# Patient Record
Sex: Female | Born: 1965 | Race: White | Hispanic: No | Marital: Married | Smoking: Current every day smoker
Health system: Southern US, Community
[De-identification: ages and names within clinical notes are randomized; demographics above are authoritative.]

## PROBLEM LIST (undated history)

## (undated) DIAGNOSIS — R011 Cardiac murmur, unspecified: Secondary | ICD-10-CM

## (undated) DIAGNOSIS — E119 Type 2 diabetes mellitus without complications: Secondary | ICD-10-CM

## (undated) DIAGNOSIS — R531 Weakness: Secondary | ICD-10-CM

## (undated) DIAGNOSIS — I161 Hypertensive emergency: Secondary | ICD-10-CM

## (undated) DIAGNOSIS — I639 Cerebral infarction, unspecified: Secondary | ICD-10-CM

## (undated) DIAGNOSIS — I10 Essential (primary) hypertension: Secondary | ICD-10-CM

## (undated) DIAGNOSIS — R202 Paresthesia of skin: Secondary | ICD-10-CM

## (undated) DIAGNOSIS — E785 Hyperlipidemia, unspecified: Secondary | ICD-10-CM

## (undated) DIAGNOSIS — T7840XA Allergy, unspecified, initial encounter: Secondary | ICD-10-CM

## (undated) HISTORY — DX: Hypertensive emergency: I16.1

## (undated) HISTORY — DX: Weakness: R53.1

## (undated) HISTORY — DX: Hyperlipidemia, unspecified: E78.5

## (undated) HISTORY — DX: Allergy, unspecified, initial encounter: T78.40XA

## (undated) HISTORY — DX: Paresthesia of skin: R20.2

## (undated) HISTORY — PX: OTHER SURGICAL HISTORY: SHX169

## (undated) HISTORY — DX: Cardiac murmur, unspecified: R01.1

## (undated) HISTORY — DX: Cerebral infarction, unspecified: I63.9

## (undated) HISTORY — DX: Type 2 diabetes mellitus without complications: E11.9

---

## 2005-05-25 ENCOUNTER — Emergency Department (HOSPITAL_COMMUNITY): Admission: EM | Admit: 2005-05-25 | Discharge: 2005-05-25 | Payer: Self-pay | Admitting: Emergency Medicine

## 2012-12-23 ENCOUNTER — Other Ambulatory Visit: Payer: Self-pay | Admitting: Family Medicine

## 2012-12-23 DIAGNOSIS — Z1231 Encounter for screening mammogram for malignant neoplasm of breast: Secondary | ICD-10-CM

## 2013-01-22 ENCOUNTER — Ambulatory Visit
Admission: RE | Admit: 2013-01-22 | Discharge: 2013-01-22 | Disposition: A | Discharge status: Home / Self Care | Payer: No Typology Code available for payment source | Source: Ambulatory Visit | Attending: Family Medicine | Admitting: Family Medicine

## 2013-01-22 DIAGNOSIS — Z1231 Encounter for screening mammogram for malignant neoplasm of breast: Secondary | ICD-10-CM

## 2016-06-08 ENCOUNTER — Emergency Department (HOSPITAL_COMMUNITY): Payer: 59

## 2016-06-08 ENCOUNTER — Encounter (HOSPITAL_COMMUNITY): Payer: Self-pay | Admitting: Emergency Medicine

## 2016-06-08 ENCOUNTER — Inpatient Hospital Stay (HOSPITAL_COMMUNITY)
Admission: EM | Admit: 2016-06-08 | Discharge: 2016-06-10 | DRG: 065 | Disposition: A | Discharge status: Home / Self Care | Payer: 59 | Attending: Internal Medicine | Admitting: Internal Medicine

## 2016-06-08 ENCOUNTER — Inpatient Hospital Stay (HOSPITAL_COMMUNITY): Payer: 59

## 2016-06-08 DIAGNOSIS — E1165 Type 2 diabetes mellitus with hyperglycemia: Secondary | ICD-10-CM | POA: Diagnosis present

## 2016-06-08 DIAGNOSIS — Z23 Encounter for immunization: Secondary | ICD-10-CM | POA: Diagnosis not present

## 2016-06-08 DIAGNOSIS — I639 Cerebral infarction, unspecified: Secondary | ICD-10-CM | POA: Diagnosis not present

## 2016-06-08 DIAGNOSIS — E669 Obesity, unspecified: Secondary | ICD-10-CM | POA: Diagnosis present

## 2016-06-08 DIAGNOSIS — Z720 Tobacco use: Secondary | ICD-10-CM

## 2016-06-08 DIAGNOSIS — E785 Hyperlipidemia, unspecified: Secondary | ICD-10-CM | POA: Diagnosis present

## 2016-06-08 DIAGNOSIS — R29702 NIHSS score 2: Secondary | ICD-10-CM | POA: Diagnosis present

## 2016-06-08 DIAGNOSIS — Z823 Family history of stroke: Secondary | ICD-10-CM

## 2016-06-08 DIAGNOSIS — R531 Weakness: Secondary | ICD-10-CM

## 2016-06-08 DIAGNOSIS — R29810 Facial weakness: Secondary | ICD-10-CM | POA: Diagnosis present

## 2016-06-08 DIAGNOSIS — I10 Essential (primary) hypertension: Secondary | ICD-10-CM | POA: Diagnosis present

## 2016-06-08 DIAGNOSIS — E876 Hypokalemia: Secondary | ICD-10-CM | POA: Diagnosis present

## 2016-06-08 DIAGNOSIS — G459 Transient cerebral ischemic attack, unspecified: Secondary | ICD-10-CM | POA: Diagnosis not present

## 2016-06-08 DIAGNOSIS — Z885 Allergy status to narcotic agent status: Secondary | ICD-10-CM

## 2016-06-08 DIAGNOSIS — F17210 Nicotine dependence, cigarettes, uncomplicated: Secondary | ICD-10-CM | POA: Diagnosis present

## 2016-06-08 DIAGNOSIS — I161 Hypertensive emergency: Secondary | ICD-10-CM

## 2016-06-08 DIAGNOSIS — R739 Hyperglycemia, unspecified: Secondary | ICD-10-CM

## 2016-06-08 DIAGNOSIS — R278 Other lack of coordination: Secondary | ICD-10-CM

## 2016-06-08 DIAGNOSIS — Z6837 Body mass index (BMI) 37.0-37.9, adult: Secondary | ICD-10-CM

## 2016-06-08 DIAGNOSIS — G8194 Hemiplegia, unspecified affecting left nondominant side: Secondary | ICD-10-CM | POA: Diagnosis present

## 2016-06-08 HISTORY — DX: Weakness: R53.1

## 2016-06-08 HISTORY — DX: Essential (primary) hypertension: I10

## 2016-06-08 HISTORY — DX: Hypertensive emergency: I16.1

## 2016-06-08 LAB — CBC WITH DIFFERENTIAL/PLATELET
BASOS ABS: 0.1 10*3/uL (ref 0.0–0.1)
BASOS PCT: 1 %
EOS ABS: 0.3 10*3/uL (ref 0.0–0.7)
Eosinophils Relative: 3 %
HCT: 44.4 % (ref 36.0–46.0)
Hemoglobin: 15.8 g/dL — ABNORMAL HIGH (ref 12.0–15.0)
LYMPHS PCT: 36 %
Lymphs Abs: 3.8 10*3/uL (ref 0.7–4.0)
MCH: 31.5 pg (ref 26.0–34.0)
MCHC: 35.6 g/dL (ref 30.0–36.0)
MCV: 88.6 fL (ref 78.0–100.0)
Monocytes Absolute: 0.8 10*3/uL (ref 0.1–1.0)
Monocytes Relative: 7 %
Neutro Abs: 5.5 10*3/uL (ref 1.7–7.7)
Neutrophils Relative %: 53 %
PLATELETS: 194 10*3/uL (ref 150–400)
RBC: 5.01 MIL/uL (ref 3.87–5.11)
RDW: 11.8 % (ref 11.5–15.5)
WBC: 10.3 10*3/uL (ref 4.0–10.5)

## 2016-06-08 LAB — COMPREHENSIVE METABOLIC PANEL
ALBUMIN: 4.3 g/dL (ref 3.5–5.0)
ALT: 24 U/L (ref 14–54)
AST: 26 U/L (ref 15–41)
Alkaline Phosphatase: 106 U/L (ref 38–126)
Anion gap: 8 (ref 5–15)
BUN: 13 mg/dL (ref 6–20)
CALCIUM: 9.5 mg/dL (ref 8.9–10.3)
CO2: 27 mmol/L (ref 22–32)
Chloride: 105 mmol/L (ref 101–111)
Creatinine, Ser: 0.86 mg/dL (ref 0.44–1.00)
GFR calc Af Amer: >60 mL/min (ref >60)
GFR calc non Af Amer: >60 mL/min (ref >60)
GLUCOSE: 113 mg/dL — AB (ref 65–99)
POTASSIUM: 3.3 mmol/L — AB (ref 3.5–5.1)
SODIUM: 140 mmol/L (ref 135–145)
TOTAL PROTEIN: 7.6 g/dL (ref 6.5–8.1)
Total Bilirubin: 0.5 mg/dL (ref 0.3–1.2)

## 2016-06-08 LAB — PROTIME-INR
INR: 0.99
PROTHROMBIN TIME: 13 s (ref 11.4–15.2)

## 2016-06-08 MED ORDER — POTASSIUM CHLORIDE CRYS ER 20 MEQ PO TBCR
20.0000 meq | EXTENDED_RELEASE_TABLET | Freq: Once | ORAL | Status: AC
  Administered 2016-06-08: 20 meq via ORAL
  Filled 2016-06-08: qty 1

## 2016-06-08 MED ORDER — IOPAMIDOL (ISOVUE-370) INJECTION 76%
75.0000 mL | Freq: Once | INTRAVENOUS | Status: AC | PRN
  Administered 2016-06-08: 75 mL via INTRAVENOUS

## 2016-06-08 MED ORDER — HYDRALAZINE HCL 20 MG/ML IJ SOLN
10.0000 mg | Freq: Four times a day (QID) | INTRAMUSCULAR | Status: DC | PRN
  Administered 2016-06-09: 10 mg via INTRAVENOUS
  Filled 2016-06-08: qty 1

## 2016-06-08 MED ORDER — NICOTINE 21 MG/24HR TD PT24
21.0000 mg | MEDICATED_PATCH | Freq: Every day | TRANSDERMAL | Status: DC | PRN
  Administered 2016-06-09: 21 mg via TRANSDERMAL
  Filled 2016-06-08 (×2): qty 1

## 2016-06-08 MED ORDER — HYDRALAZINE HCL 20 MG/ML IJ SOLN
5.0000 mg | Freq: Once | INTRAMUSCULAR | Status: AC
  Administered 2016-06-08: 5 mg via INTRAVENOUS
  Filled 2016-06-08: qty 1

## 2016-06-08 MED ORDER — PNEUMOCOCCAL VAC POLYVALENT 25 MCG/0.5ML IJ INJ: 0.5000 mL | INJECTION | INTRAMUSCULAR | Status: DC

## 2016-06-08 MED ORDER — STROKE: EARLY STAGES OF RECOVERY BOOK
Freq: Once | Status: DC
  Filled 2016-06-08: qty 1

## 2016-06-08 MED ORDER — INFLUENZA VAC SPLIT QUAD 0.5 ML IM SUSY
0.5000 mL | PREFILLED_SYRINGE | INTRAMUSCULAR | Status: AC
  Administered 2016-06-09: 0.5 mL via INTRAMUSCULAR
  Filled 2016-06-08: qty 0.5

## 2016-06-08 MED ORDER — AMLODIPINE BESYLATE 5 MG PO TABS
5.0000 mg | ORAL_TABLET | Freq: Every day | ORAL | Status: DC
  Administered 2016-06-08 – 2016-06-10 (×3): 5 mg via ORAL
  Filled 2016-06-08 (×3): qty 1

## 2016-06-08 NOTE — ED Triage Notes (Signed)
Patient reports waking this morning at 6am she was "feeling off balanced." Per patient weakness on left side with change in motor skills (ability to grip). Per patient "walking off balance." Denies any slurred speech, dizziness, confusion, or facial drooping. Per patient when she went to bed at 9pm yesterday she was fine.  Patient does report slight headache "that just started."

## 2016-06-08 NOTE — ED Provider Notes (Signed)
[facility]Y DEPT Provider Note   CSN: [id number] Arrival date & time: 06/08/16  1403     History   Chief Complaint Chief Complaint  Patient presents with  . Extremity Weakness    HPI Dana Ryan is a 50 y.o. female.  Patient states she went to bed around 9:00 last night and felt fine and then woke up this morning with weakness in her left arm and left leg   The history is provided by the patient. No language interpreter was used.  Extremity Weakness  This is a new problem. The current episode started 12 to 24 hours ago. The problem occurs constantly. The problem has not changed since onset.Pertinent negatives include no chest pain, no abdominal pain and no headaches. Nothing aggravates the symptoms. Nothing relieves the symptoms. She has tried nothing for the symptoms. The treatment provided no relief.    Past Medical History:  Diagnosis Date  . Diabetes mellitus without complication (HCC)     There are no active problems to display for this patient.   History reviewed. No pertinent surgical history.  OB History    Gravida Para Term Preterm AB Living   0 0 0 0 0 0   SAB TAB Ectopic Multiple Live Births   0 0 0 0 0       Home Medications    Prior to Admission medications   Not on File    Family History Family History  Problem Relation Age of Onset  . Cancer Mother   . Hyperlipidemia Mother   . Cancer Father   . Hyperlipidemia Father     Social History Social History  Substance Use Topics  . Smoking status: Current Some Day Smoker    Packs/day: 1.00    Years: 20.00    Types: Cigarettes  . Smokeless tobacco: Never Used  . Alcohol use Yes     Comment: rarely     Allergies   Codeine   Review of Systems Review of Systems  Constitutional: Negative for appetite change and fatigue.  HENT: Negative for congestion, ear discharge and sinus pressure.   Eyes: Negative for discharge.  Respiratory: Negative for cough.   Cardiovascular:  Negative for chest pain.  Gastrointestinal: Negative for abdominal pain and diarrhea.  Genitourinary: Negative for frequency and hematuria.  Musculoskeletal: Positive for extremity weakness. Negative for back pain.  Skin: Negative for rash.  Neurological: Negative for seizures and headaches.       Weakness left arm left leg  Psychiatric/Behavioral: Negative for hallucinations.     Physical Exam Updated Vital Signs BP (!) 181/118 (BP Location: Left Arm)   Pulse 79   Temp 98.2 F (36.8 C) (Oral)   Resp 13   Ht 5' 1.6" (1.565 m)   Wt 200 lb (90.7 kg)   SpO2 99%   BMI 37.06 kg/m   Physical Exam  Constitutional: She is oriented to person, place, and time. She appears well-developed.  HENT:  Head: Normocephalic.  Eyes: Conjunctivae and EOM are normal. No scleral icterus.  Neck: Neck supple. No thyromegaly present.  Cardiovascular: Normal rate and regular rhythm.  Exam reveals no gallop and no friction rub.   No murmur heard. Pulmonary/Chest: No stridor. She has no wheezes. She has no rales. She exhibits no tenderness.  Abdominal: She exhibits no distension. There is no tenderness. There is no rebound.  Musculoskeletal: Normal range of motion. She exhibits no edema.  Lymphadenopathy:    She has no cervical adenopathy.  Neurological: She is oriented  to person, place, and time. She exhibits normal muscle tone. Coordination abnormal.  Patient has poor coordination in her left hand along with mild weakness in the left hand and left leg neurovascular exams normal  Skin: No rash noted. No erythema.  Psychiatric: She has a normal mood and affect. Her behavior is normal.     ED Treatments / Results  Labs (all labs ordered are listed, but only abnormal results are displayed) Labs Reviewed  CBC WITH DIFFERENTIAL/PLATELET - Abnormal; Notable for the following:       Result Value   Hemoglobin 15.8 (*)    All other components within normal limits  COMPREHENSIVE METABOLIC PANEL -  Abnormal; Notable for the following:    Potassium 3.3 (*)    Glucose, Bld 113 (*)    All other components within normal limits  PROTIME-INR    EKG  EKG Interpretation None       Radiology Ct Head Wo Contrast  Result Date: 06/08/2016 CLINICAL DATA:  50 year old female with a history of left-sided weakness EXAM: CT HEAD WITHOUT CONTRAST TECHNIQUE: Contiguous axial images were obtained from the base of the skull through the vertex without intravenous contrast. COMPARISON:  None. FINDINGS: Brain: No acute intracranial hemorrhage. No midline shift or mass effect. Gray-white differentiation maintained. Unremarkable appearance of the ventricular system. Vascular: Calcifications of intracranial vasculature. Relatively dense appearance of the basilar artery. Skull: No acute fracture.  No aggressive bone lesion identified. Sinuses/Orbits: Unremarkable appearance of the orbits. Mastoid air cells clear. No middle ear effusion. No significant sinus disease. Other: None IMPRESSION: No CT evidence of acute intracranial abnormality. Relatively dense appearance of the basilar artery, which is a nonspecific finding, however, if there were concern for acute neurologic problem and a possibility of thrombosis, CTA may be considered. Signed, Yvone NeuJaime S. Loreta AveWagner, DO [facility] Electronically Signed   By: Gilmer MorJaime  Wagner D.O.   On: 06/08/2016 15:17    Procedures Procedures (including critical care time)  Medications Ordered in ED Medications  hydrALAZINE (APRESOLINE) injection 5 mg (5 mg Intravenous Given 06/08/16 1440)     Initial Impression / Assessment and Plan / ED Course  I have reviewed the triage vital signs and the nursing notes.  Pertinent labs & imaging results that were available during my care of the patient were reviewed by me and considered in my medical decision making (see chart for details).  Clinical Course   CRITICAL CARE Performed  by: Leonid Manus L Total critical care time: 45 minutes Critical care time was exclusive of separately billable procedures and treating other patients. Critical care was necessary to treat or prevent imminent or life-threatening deterioration. Critical care was time spent personally by me on the following activities: development of treatment plan with patient and/or surrogate as well as nursing, discussions with consultants, evaluation of patient's response to treatment, examination of patient, obtaining history from patient or surrogate, ordering and performing treatments and interventions, ordering and review of laboratory studies, ordering and review of radiographic studies, pulse oximetry and re-evaluation of patient's condition. Patient with weakness in left arm and left leg. CT scan does not show acute stroke.  Patient will get a CTA and be admitted  for stroke  Final Clinical Impressions(s) / ED Diagnoses   Final diagnoses:  None    New Prescriptions New Prescriptions   No medications on file     Bethann BerkshireJoseph Imad Shostak, MD 06/08/16 1538

## 2016-06-08 NOTE — H&P (Signed)
[facility] H&P   Patient Demographics:    Dana Ryan, is a 50 y.o. female  MRN: [id number]   DOB - Aug 11, 1966  Admit Date - 06/08/2016  Outpatient Primary MD for the patient is No PCP Per Patient  Referring MD/NP/PA:  Dorthula PerfectJoseph Zammitt  Outpatient Specialists:   Patient coming from: home  Chief Complaint  Patient presents with  . Extremity Weakness      HPI:    Dana InaMichelle Armitage  is a 50 y.o. female, with hypertension, (recently had bp medication changed), tobacco use,  w c/o left upper extremity and left lower ext weakness upon waking this am.  Pt states that her weakness is currently significantly better.  Pt denies headache, vision change, slurred speech, cp, palp, sob, numbness, tingling. Pt presented to the ED for evaluation.   In, ED, pt noted to be hypertensive and CT brain negative for CVA.  CTA neck no significant stenosis. Pt will be admitted for TIA ro CVA.      Review of systems:    In addition to the HPI above,  No Fever-chills, No Headache, No changes with Vision or hearing, No problems swallowing food or Liquids, No Chest pain, Cough or Shortness of Breath, No Abdominal pain, No Nausea or Vommitting, Bowel movements are regular, No Blood in stool or Urine, No dysuria, No new skin rashes or bruises, No new joints pains-aches,  No new tingling, numbness in any extremity, No recent weight gain or loss, No polyuria, polydypsia or polyphagia, No significant Mental Stressors.  A full 10 point Review of Systems was done, except as stated above, all other Review of Systems were negative.   With Past History of the following :    Past Medical History:  Diagnosis Date  . Hypertension       History reviewed. No pertinent surgical history. No surgical history per pt   Social History:     Social History  Substance Use Topics  . Smoking status:  Current Some Day Smoker    Packs/day: 1.00    Years: 20.00    Types: Cigarettes  . Smokeless tobacco: Never Used  . Alcohol use 0.0 oz/week     Comment: rarely     Lives -  At home  Mobility -   Mobile, walks by self   Family History :     Family History  Problem Relation Age of Onset  . Cancer Mother   . Hyperlipidemia Mother   . Cancer Father    Maternal grandfather had CVA   Home Medications:   Prior to Admission medications   Not on File  Pt not certain   Allergies:     Allergies  Allergen Reactions  . Codeine Hives     Physical Exam:   Vitals  Blood pressure 156/99, pulse 68, temperature 98.2 F (36.8 C), temperature source Oral, resp. rate 18, height 5' 1.6" (  1.565 m), weight 90.7 kg (200 lb), SpO2 97 %.   1. General  lying in bed in NAD,    2. Normal affect and insight, Not Suicidal or Homicidal, Awake Alert, Oriented X 3.  3. No F.N deficits, ALL C.Nerves Intact, Strength 5/5 all 4 extremities except  5-/5 grip, and 5-/5 plantar flexion Sensation intact all 4 extremities, Plantars down going.    4. Ears and Eyes appear Normal, Conjunctivae clear, PERRLA. Moist Oral Mucosa.  5. Supple Neck, No JVD, No cervical lymphadenopathy appriciated, No Carotid Bruits.  6. Symmetrical Chest wall movement, Good air movement bilaterally, CTAB.  7. RRR, No Gallops, Rubs or Murmurs, No Parasternal Heave.  8. Positive Bowel Sounds, Abdomen Soft, No tenderness, No organomegaly appriciated,No rebound -guarding or rigidity.  9.  No Cyanosis, Normal Skin Turgor, No Skin Rash or Bruise.  10. Good muscle tone,  joints appear normal , no effusions, Normal ROM.  11. No Palpable Lymph Nodes in Neck or Axillae     Data Review:    CBC  Recent Labs Lab 06/08/16 1429  WBC 10.3  HGB 15.8*  HCT 44.4  PLT 194  MCV 88.6  MCH 31.5  MCHC 35.6  RDW 11.8  LYMPHSABS 3.8  MONOABS 0.8  EOSABS 0.3  BASOSABS 0.1    ------------------------------------------------------------------------------------------------------------------  Chemistries   Recent Labs Lab 06/08/16 1429  NA 140  K 3.3*  CL 105  CO2 27  GLUCOSE 113*  BUN 13  CREATININE 0.86  CALCIUM 9.5  AST 26  ALT 24  ALKPHOS 106  BILITOT 0.5   ------------------------------------------------------------------------------------------------------------------ estimated creatinine clearance is 82.2 mL/min (by C-G formula based on SCr of 0.86 mg/dL). ------------------------------------------------------------------------------------------------------------------ No results for input(s): TSH, T4TOTAL, T3FREE, THYROIDAB in the last 72 hours.  Invalid input(s): FREET3  Coagulation profile  Recent Labs Lab 06/08/16 1429  INR 0.99   ------------------------------------------------------------------------------------------------------------------- No results for input(s): DDIMER in the last 72 hours. -------------------------------------------------------------------------------------------------------------------  Cardiac Enzymes No results for input(s): CKMB, TROPONINI, MYOGLOBIN in the last 168 hours.  Invalid input(s): CK ------------------------------------------------------------------------------------------------------------------ No results found for: BNP   ---------------------------------------------------------------------------------------------------------------  Urinalysis No results found for: COLORURINE, APPEARANCEUR, LABSPEC, PHURINE, GLUCOSEU, HGBUR, BILIRUBINUR, KETONESUR, PROTEINUR, UROBILINOGEN, NITRITE, LEUKOCYTESUR  ----------------------------------------------------------------------------------------------------------------   Imaging Results:    Ct Angio Head W Or Wo Contrast  Result Date: 06/08/2016 CLINICAL DATA:  Patient awoke earlier today with LEFT-sided weakness. Imbalance and headache.  EXAM: CT ANGIOGRAPHY HEAD AND NECK TECHNIQUE: Multidetector CT imaging of the head and neck was performed using the standard protocol during bolus administration of intravenous contrast. Multiplanar CT image reconstructions and MIPs were obtained to evaluate the vascular anatomy. Carotid stenosis measurements (when applicable) are obtained utilizing NASCET criteria, using the distal internal carotid diameter as the denominator. CONTRAST:  Isovue 370, 75 mL. COMPARISON:  CT head earlier today. FINDINGS: CT HEAD Calvarium and skull base: No fracture or destructive lesion. Mastoids and middle ears are grossly clear. Paranasal sinuses: Imaged portions are clear. Orbits: Negative. Brain: No evidence of acute abnormality, including acute infarct, hemorrhage, hydrocephalus, or mass lesion. CTA NECK Aortic arch: Normal variant branching, with direct origin of the LEFT vertebral from the arch. Imaged portion shows no evidence of aneurysm or dissection. No significant stenosis of the major arch vessel origins. Right carotid system: Minor atheromatous change. No evidence of dissection, stenosis (50% or greater) or occlusion. Left carotid system: Minor atheromatous change. No evidence of dissection, stenosis (50% or greater) or occlusion. Vertebral arteries: RIGHT vertebral dominant. No evidence of dissection,  stenosis (50% or greater) or occlusion. Nonvascular soft tissues:  Unremarkable. Review of the MIP images confirms the above findings. CTA HEAD Anterior circulation: Minor cavernous calcification, insignificant. No significant stenosis, proximal occlusion, aneurysm, or vascular malformation. Posterior circulation: No evidence for basilar thrombosis. Appearance on CT was artifactual. Minor non stenotic calcification distal RIGHT vertebral artery. No significant stenosis, proximal occlusion, aneurysm, or vascular malformation. Venous sinuses: As permitted by contrast timing, patent. Anatomic variants: Hypoplastic A1 ACA,  RIGHT. Delayed phase:   No abnormal intracranial enhancement. Review of the MIP images confirms the above findings. IMPRESSION: No intracranial or extracranial flow reducing lesion of significance. Special attention was directed to the CT finding of dense basilar artery, with no evidence for thrombosis. Electronically Signed   By: Elsie Stain M.D.   On: 06/08/2016 16:31   Ct Head Wo Contrast  Result Date: 06/08/2016 CLINICAL DATA:  50 year old female with a history of left-sided weakness EXAM: CT HEAD WITHOUT CONTRAST TECHNIQUE: Contiguous axial images were obtained from the base of the skull through the vertex without intravenous contrast. COMPARISON:  None. FINDINGS: Brain: No acute intracranial hemorrhage. No midline shift or mass effect. Gray-white differentiation maintained. Unremarkable appearance of the ventricular system. Vascular: Calcifications of intracranial vasculature. Relatively dense appearance of the basilar artery. Skull: No acute fracture.  No aggressive bone lesion identified. Sinuses/Orbits: Unremarkable appearance of the orbits. Mastoid air cells clear. No middle ear effusion. No significant sinus disease. Other: None IMPRESSION: No CT evidence of acute intracranial abnormality. Relatively dense appearance of the basilar artery, which is a nonspecific finding, however, if there were concern for acute neurologic problem and a possibility of thrombosis, CTA may be considered. Signed, Yvone Neu. Loreta Ave, DO [facility] Electronically Signed   By: Gilmer Mor D.O.   On: 06/08/2016 15:17   Ct Angio Neck W And/or Wo Contrast  Result Date: 06/08/2016 CLINICAL DATA:  Patient awoke earlier today with LEFT-sided weakness. Imbalance and headache. EXAM: CT ANGIOGRAPHY HEAD AND NECK TECHNIQUE: Multidetector CT imaging of the head and neck was performed using the standard protocol during bolus administration of intravenous contrast.  Multiplanar CT image reconstructions and MIPs were obtained to evaluate the vascular anatomy. Carotid stenosis measurements (when applicable) are obtained utilizing NASCET criteria, using the distal internal carotid diameter as the denominator. CONTRAST:  Isovue 370, 75 mL. COMPARISON:  CT head earlier today. FINDINGS: CT HEAD Calvarium and skull base: No fracture or destructive lesion. Mastoids and middle ears are grossly clear. Paranasal sinuses: Imaged portions are clear. Orbits: Negative. Brain: No evidence of acute abnormality, including acute infarct, hemorrhage, hydrocephalus, or mass lesion. CTA NECK Aortic arch: Normal variant branching, with direct origin of the LEFT vertebral from the arch. Imaged portion shows no evidence of aneurysm or dissection. No significant stenosis of the major arch vessel origins. Right carotid system: Minor atheromatous change. No evidence of dissection, stenosis (50% or greater) or occlusion. Left carotid system: Minor atheromatous change. No evidence of dissection, stenosis (50% or greater) or occlusion. Vertebral arteries: RIGHT vertebral dominant. No evidence of dissection, stenosis (50% or greater) or occlusion. Nonvascular soft tissues:  Unremarkable. Review of the MIP images confirms the above findings. CTA HEAD Anterior circulation: Minor cavernous calcification, insignificant. No significant stenosis, proximal occlusion, aneurysm, or vascular malformation. Posterior circulation: No evidence for basilar thrombosis. Appearance on CT was artifactual. Minor non stenotic calcification distal RIGHT vertebral artery. No significant stenosis, proximal occlusion, aneurysm, or vascular malformation. Venous sinuses: As permitted by contrast  timing, patent. Anatomic variants: Hypoplastic A1 ACA, RIGHT. Delayed phase:   No abnormal intracranial enhancement. Review of the MIP images confirms the above findings. IMPRESSION: No intracranial or extracranial flow reducing lesion of  significance. Special attention was directed to the CT finding of dense basilar artery, with no evidence for thrombosis. Electronically Signed   By: Elsie Stain M.D.   On: 06/08/2016 16:31   NSR at 80, lad, q in v1-3, no st-t changes c/w ischemia   Assessment & Plan:    Active Problems:   Left-sided weakness   Hypertensive emergency   Hypokalemia   Hyperglycemia   Tobacco use    1. TIA Tele MRI brain Aspirin, lipitor Start amlodipine 5mg  po qday (since was on this at home) Check hga1c, lipid  2. Hypokalemia Replete Check cmp  3. Hyperglycemia Check hga1c,   4. Tobacco use Nicotine patch  5. Hypertensive emergency Hydralazine 10mg  iv q6hprn sbp >170  DVT Prophylaxis  - SCDs   AM Labs Ordered, also please review Full Orders  Family Communication: Admission, patients condition and plan of care including tests being ordered have been discussed with the patient  who indicate understanding and agree with the plan and Code Status.  Code Status FULL CODE  Likely DC to  home  Condition GUARDED   Consults called:  Neurology requested  Admission status: inpatient  Time spent in minutes : 50 minutes   Pearson Grippe M.D on 06/08/2016 at 6:11 PM  Between 7am to 7pm - Pager - [phone] After 7pm go to [email or web address] - password [facility]  [facility] - Office  [phone]

## 2016-06-08 NOTE — ED Notes (Addendum)
Delay in sending pt to room as she was not sure she was going to stay.  Hospitalist went to talk with pt and now she is going to stay.

## 2016-06-09 ENCOUNTER — Inpatient Hospital Stay (HOSPITAL_COMMUNITY): Payer: 59

## 2016-06-09 DIAGNOSIS — G459 Transient cerebral ischemic attack, unspecified: Secondary | ICD-10-CM

## 2016-06-09 DIAGNOSIS — R531 Weakness: Secondary | ICD-10-CM

## 2016-06-09 LAB — ECHOCARDIOGRAM COMPLETE
Ao-asc: 31 cm
CHL CUP DOP CALC LVOT VTI: 30.9 cm
CHL CUP MV DEC (S): 306
E/e' ratio: 16.66
EWDT: 306 ms
FS: 37 % (ref 28–44)
HEIGHTINCHES: 62 in
IV/PV OW: 1.24
LA ID, A-P, ES: 33 mm
LA diam index: 1.61 cm/m2
LA vol A4C: 54.1 ml
LA vol index: 21.4 mL/m2
LA vol: 43.9 mL
LEFT ATRIUM END SYS DIAM: 33 mm
LV E/e' medial: 16.66
LV PW d: 11.5 mm — AB (ref 0.6–1.1)
LV TDI E'LATERAL: 5.33
LV TDI E'MEDIAL: 4.57
LV e' LATERAL: 5.33 cm/s
LVEEAVG: 16.66
LVOT SV: 62 mL
LVOT area: 2.01 cm2
LVOT peak grad rest: 7 mmHg
LVOTD: 16 mm
LVOTPV: 129 cm/s
MV Peak grad: 3 mmHg
MV pk A vel: 98.5 m/s
MV pk E vel: 88.8 m/s
TAPSE: 23 mm
WEIGHTICAEL: 3241.64 [oz_av]

## 2016-06-09 LAB — COMPREHENSIVE METABOLIC PANEL
ALT: 25 U/L (ref 14–54)
AST: 26 U/L (ref 15–41)
Albumin: 4.3 g/dL (ref 3.5–5.0)
Alkaline Phosphatase: 99 U/L (ref 38–126)
Anion gap: 9 (ref 5–15)
BUN: 12 mg/dL (ref 6–20)
CHLORIDE: 103 mmol/L (ref 101–111)
CO2: 25 mmol/L (ref 22–32)
CREATININE: 0.88 mg/dL (ref 0.44–1.00)
Calcium: 9.6 mg/dL (ref 8.9–10.3)
GFR calc Af Amer: >60 mL/min (ref >60)
GFR calc non Af Amer: >60 mL/min (ref >60)
Glucose, Bld: 135 mg/dL — ABNORMAL HIGH (ref 65–99)
Potassium: 3.8 mmol/L (ref 3.5–5.1)
SODIUM: 137 mmol/L (ref 135–145)
Total Bilirubin: 0.7 mg/dL (ref 0.3–1.2)
Total Protein: 7.7 g/dL (ref 6.5–8.1)

## 2016-06-09 LAB — LIPID PANEL
CHOL/HDL RATIO: 4.4 ratio
Cholesterol: 190 mg/dL (ref 0–200)
HDL: 43 mg/dL (ref >40)
LDL Cholesterol: 128 mg/dL — ABNORMAL HIGH (ref 0–99)
Triglycerides: 93 mg/dL (ref <150)
VLDL: 19 mg/dL (ref 0–40)

## 2016-06-09 MED ORDER — ACETAMINOPHEN 325 MG PO TABS
650.0000 mg | ORAL_TABLET | Freq: Four times a day (QID) | ORAL | Status: DC | PRN
  Administered 2016-06-09 – 2016-06-10 (×2): 650 mg via ORAL
  Filled 2016-06-09 (×2): qty 2

## 2016-06-09 MED ORDER — ENOXAPARIN SODIUM 60 MG/0.6ML ~~LOC~~ SOLN
45.0000 mg | SUBCUTANEOUS | Status: DC
  Administered 2016-06-09: 45 mg via SUBCUTANEOUS
  Filled 2016-06-09: qty 0.6

## 2016-06-09 MED ORDER — ATORVASTATIN CALCIUM 40 MG PO TABS
80.0000 mg | ORAL_TABLET | Freq: Every day | ORAL | Status: DC
  Administered 2016-06-09: 80 mg via ORAL
  Filled 2016-06-09: qty 2

## 2016-06-09 MED ORDER — ASPIRIN EC 81 MG PO TBEC
81.0000 mg | DELAYED_RELEASE_TABLET | Freq: Every day | ORAL | Status: DC
  Administered 2016-06-10: 81 mg via ORAL
  Filled 2016-06-09: qty 1

## 2016-06-09 NOTE — Progress Notes (Signed)
SLP Cancellation Note  Patient Details Name: Dana Ryan MRN: [id number] DOB: August 14, 1966   Cancelled treatment:       Reason Eval/Treat Not Completed: SLP screened, no needs identified, will sign off; SLP spoke with pt and spouse in room. Pt denies changes in cognition, swallowing, speech, or swallowing. She does endorse a headache and decreased fine motor skills in left hand. SLP suspects very mild dysarthria and husband agrees (very slight). Pt most concerned about not being able to return to typing 70 words per minute and quilting. Pt observed drinking soda during my visit and no difficulties noted (pt passed RN swallow screen). Will defer formal SLE at this time. Pt/spouse appreciative for visit.   Thank you,  Dana Ryan, Dana Ryan [phone]    Dana Ryan 06/09/2016, 3:31 PM

## 2016-06-09 NOTE — Progress Notes (Signed)
Notified Dr. Felecia ShellingFanta for the patients request to eat.  Voiced to him that the patient had passed her RN swallow screen and was waiting to see the neuro and MRI.  New orders given and followed.

## 2016-06-09 NOTE — Progress Notes (Signed)
PT Cancellation Note  Patient Details Name: Dana Ryan MRN: [id number] DOB: 01/07/1966   Cancelled Treatment:     Theapist spoke to pt.  Pt is up walking halls with husband feels that she does not need physical therapy services   Virgina OrganCynthia Russell, PT CLT [phone] 06/09/2016, 12:35 PM

## 2016-06-09 NOTE — Progress Notes (Signed)
  Echocardiogram 2D Echocardiogram has been performed.  Dana Ryan, Dana Ryan 06/09/2016, 10:49 AM

## 2016-06-09 NOTE — Progress Notes (Signed)
PROGRESS NOTE    Dana Ryan  ZOX:[id number] DOB: Nov 11, 1965 DOA: 06/08/2016 PCP: No PCP Per Patient     Brief Narrative:  50 year old woman admitted from home on 10/7 with complaints of left-sided weakness. Was admitted for TIA versus CVA.   Assessment & Plan:   Active Problems:   Left-sided weakness   Hypertensive emergency   Hypokalemia   Hyperglycemia   Tobacco use   TIA (transient ischemic attack)   Left-sided weakness -Initial CT head negative, MRI not available until tomorrow. -Suspect she has had a CVA versus a TIA given her left-sided facial droop and residual weakness of her left arm and leg. -Continue aspirin for secondary stroke prevention. -Echo ordered and pending. -CTA of the neck shows no intra-or extracranial flow reducing lesion of significance. -PT and ST have deferred evaluation given improved symptoms. -Patient advised on risk factor modification including tobacco cessation, improved management of hypertension, weight reduction. Check lipid panel to see if statin indicated.   DVT prophylaxis: Lovenox Code Status: Full code Family Communication: Patient only Disposition Plan: Anticipate discharge home in 24 hours once MRI complete  Consultants:   None  Procedures:   None  Antimicrobials:   None    Subjective: Feels improved, still has residual weakness of her left arm and left leg, specifically mentions decrease in fine motor skills  Objective: Vitals:   06/09/16 0746 06/09/16 1150 06/09/16 1315 06/09/16 1358  BP: (!) 184/94 (!) 178/100 (!) 158/84 (!) 147/77  Pulse: 72 64  86  Resp: 18     Temp: 98 F (36.7 C)     TempSrc: Oral     SpO2: 97%     Weight:      Height:       No intake or output data in the 24 hours ending 06/09/16 1639 Filed Weights   06/08/16 1411 06/08/16 1915  Weight: 90.7 kg (200 lb) 91.9 kg (202 lb 9.6 oz)    Examination:  General exam: Alert, awake, oriented x 3 Respiratory system: Clear to  auscultation. Respiratory effort normal. Cardiovascular system:RRR. No murmurs, rubs, gallops. Gastrointestinal system: Abdomen is nondistended, soft and nontender. No organomegaly or masses felt. Normal bowel sounds heard. Central nervous system: Left facial droop, minimally perceived weakness of left arm and left leg Extremities: No C/C/E, +pedal pulses Skin: No rashes, lesions or ulcers Psychiatry: Judgement and insight appear normal. Mood & affect appropriate.     Data Reviewed: I have personally reviewed following labs and imaging studies  CBC:  Recent Labs Lab 06/08/16 1429  WBC 10.3  NEUTROABS 5.5  HGB 15.8*  HCT 44.4  MCV 88.6  PLT 194   Basic Metabolic Panel:  Recent Labs Lab 06/08/16 1429 06/09/16 0724  NA 140 137  K 3.3* 3.8  CL 105 103  CO2 27 25  GLUCOSE 113* 135*  BUN 13 12  CREATININE 0.86 0.88  CALCIUM 9.5 9.6   GFR: Estimated Creatinine Clearance: 81.5 mL/min (by C-G formula based on SCr of 0.88 mg/dL). Liver Function Tests:  Recent Labs Lab 06/08/16 1429 06/09/16 0724  AST 26 26  ALT 24 25  ALKPHOS 106 99  BILITOT 0.5 0.7  PROT 7.6 7.7  ALBUMIN 4.3 4.3   No results for input(s): LIPASE, AMYLASE in the last 168 hours. No results for input(s): AMMONIA in the last 168 hours. Coagulation Profile:  Recent Labs Lab 06/08/16 1429  INR 0.99   Cardiac Enzymes: No results for input(s): CKTOTAL, CKMB, CKMBINDEX, TROPONINI in the last 168  hours. BNP (last 3 results) No results for input(s): PROBNP in the last 8760 hours. HbA1C: No results for input(s): HGBA1C in the last 72 hours. CBG: No results for input(s): GLUCAP in the last 168 hours. Lipid Profile:  Recent Labs  06/09/16 0724  CHOL 190  HDL 43  LDLCALC 128*  TRIG 93  CHOLHDL 4.4   Thyroid Function Tests: No results for input(s): TSH, T4TOTAL, FREET4, T3FREE, THYROIDAB in the last 72 hours. Anemia Panel: No results for input(s): VITAMINB12, FOLATE, FERRITIN, TIBC, IRON,  RETICCTPCT in the last 72 hours. Urine analysis: No results found for: COLORURINE, APPEARANCEUR, LABSPEC, PHURINE, GLUCOSEU, HGBUR, BILIRUBINUR, KETONESUR, PROTEINUR, UROBILINOGEN, NITRITE, LEUKOCYTESUR Sepsis Labs: @LABRCNTIP (procalcitonin:4,lacticidven:4)  )No results found for this or any previous visit (from the past 240 hour(s)).       Radiology Studies: Ct Angio Head W Or Wo Contrast  Result Date: 06/08/2016 CLINICAL DATA:  Patient awoke earlier today with LEFT-sided weakness. Imbalance and headache. EXAM: CT ANGIOGRAPHY HEAD AND NECK TECHNIQUE: Multidetector CT imaging of the head and neck was performed using the standard protocol during bolus administration of intravenous contrast. Multiplanar CT image reconstructions and MIPs were obtained to evaluate the vascular anatomy. Carotid stenosis measurements (when applicable) are obtained utilizing NASCET criteria, using the distal internal carotid diameter as the denominator. CONTRAST:  Isovue 370, 75 mL. COMPARISON:  CT head earlier today. FINDINGS: CT HEAD Calvarium and skull base: No fracture or destructive lesion. Mastoids and middle ears are grossly clear. Paranasal sinuses: Imaged portions are clear. Orbits: Negative. Brain: No evidence of acute abnormality, including acute infarct, hemorrhage, hydrocephalus, or mass lesion. CTA NECK Aortic arch: Normal variant branching, with direct origin of the LEFT vertebral from the arch. Imaged portion shows no evidence of aneurysm or dissection. No significant stenosis of the major arch vessel origins. Right carotid system: Minor atheromatous change. No evidence of dissection, stenosis (50% or greater) or occlusion. Left carotid system: Minor atheromatous change. No evidence of dissection, stenosis (50% or greater) or occlusion. Vertebral arteries: RIGHT vertebral dominant. No evidence of dissection, stenosis (50% or greater) or occlusion. Nonvascular soft tissues:  Unremarkable. Review of the MIP  images confirms the above findings. CTA HEAD Anterior circulation: Minor cavernous calcification, insignificant. No significant stenosis, proximal occlusion, aneurysm, or vascular malformation. Posterior circulation: No evidence for basilar thrombosis. Appearance on CT was artifactual. Minor non stenotic calcification distal RIGHT vertebral artery. No significant stenosis, proximal occlusion, aneurysm, or vascular malformation. Venous sinuses: As permitted by contrast timing, patent. Anatomic variants: Hypoplastic A1 ACA, RIGHT. Delayed phase:   No abnormal intracranial enhancement. Review of the MIP images confirms the above findings. IMPRESSION: No intracranial or extracranial flow reducing lesion of significance. Special attention was directed to the CT finding of dense basilar artery, with no evidence for thrombosis. Electronically Signed   By: Elsie Stain M.D.   On: 06/08/2016 16:31   Dg Chest 2 View  Result Date: 06/08/2016 CLINICAL DATA:  TIA. EXAM: CHEST  2 VIEW COMPARISON:  None. FINDINGS: Cardiomediastinal silhouette is normal. Mediastinal contours appear intact. There is no evidence of focal airspace consolidation, pleural effusion or pneumothorax. Osseous structures are without acute abnormality. Soft tissues are grossly normal. IMPRESSION: No active cardiopulmonary disease. Electronically Signed   By: Ted Mcalpine M.D.   On: 06/08/2016 21:42   Ct Head Wo Contrast  Result Date: 06/08/2016 CLINICAL DATA:  50 year old female with a history of left-sided weakness EXAM: CT HEAD WITHOUT CONTRAST TECHNIQUE: Contiguous axial images were obtained from the base  of the skull through the vertex without intravenous contrast. COMPARISON:  None. FINDINGS: Brain: No acute intracranial hemorrhage. No midline shift or mass effect. Gray-white differentiation maintained. Unremarkable appearance of the ventricular system. Vascular: Calcifications of intracranial vasculature. Relatively dense appearance of  the basilar artery. Skull: No acute fracture.  No aggressive bone lesion identified. Sinuses/Orbits: Unremarkable appearance of the orbits. Mastoid air cells clear. No middle ear effusion. No significant sinus disease. Other: None IMPRESSION: No CT evidence of acute intracranial abnormality. Relatively dense appearance of the basilar artery, which is a nonspecific finding, however, if there were concern for acute neurologic problem and a possibility of thrombosis, CTA may be considered. Signed, Yvone Neu. Loreta Ave, DO [facility] Radiology Electronically Signed   By: Gilmer Mor D.O.   On: 06/08/2016 15:17   Ct Angio Neck W And/or Wo Contrast  Result Date: 06/08/2016 CLINICAL DATA:  Patient awoke earlier today with LEFT-sided weakness. Imbalance and headache. EXAM: CT ANGIOGRAPHY HEAD AND NECK TECHNIQUE: Multidetector CT imaging of the head and neck was performed using the standard protocol during bolus administration of intravenous contrast. Multiplanar CT image reconstructions and MIPs were obtained to evaluate the vascular anatomy. Carotid stenosis measurements (when applicable) are obtained utilizing NASCET criteria, using the distal internal carotid diameter as the denominator. CONTRAST:  Isovue 370, 75 mL. COMPARISON:  CT head earlier today. FINDINGS: CT HEAD Calvarium and skull base: No fracture or destructive lesion. Mastoids and middle ears are grossly clear. Paranasal sinuses: Imaged portions are clear. Orbits: Negative. Brain: No evidence of acute abnormality, including acute infarct, hemorrhage, hydrocephalus, or mass lesion. CTA NECK Aortic arch: Normal variant branching, with direct origin of the LEFT vertebral from the arch. Imaged portion shows no evidence of aneurysm or dissection. No significant stenosis of the major arch vessel origins. Right carotid system: Minor atheromatous change. No evidence of dissection, stenosis (50% or greater) or  occlusion. Left carotid system: Minor atheromatous change. No evidence of dissection, stenosis (50% or greater) or occlusion. Vertebral arteries: RIGHT vertebral dominant. No evidence of dissection, stenosis (50% or greater) or occlusion. Nonvascular soft tissues:  Unremarkable. Review of the MIP images confirms the above findings. CTA HEAD Anterior circulation: Minor cavernous calcification, insignificant. No significant stenosis, proximal occlusion, aneurysm, or vascular malformation. Posterior circulation: No evidence for basilar thrombosis. Appearance on CT was artifactual. Minor non stenotic calcification distal RIGHT vertebral artery. No significant stenosis, proximal occlusion, aneurysm, or vascular malformation. Venous sinuses: As permitted by contrast timing, patent. Anatomic variants: Hypoplastic A1 ACA, RIGHT. Delayed phase:   No abnormal intracranial enhancement. Review of the MIP images confirms the above findings. IMPRESSION: No intracranial or extracranial flow reducing lesion of significance. Special attention was directed to the CT finding of dense basilar artery, with no evidence for thrombosis. Electronically Signed   By: Elsie Stain M.D.   On: 06/08/2016 16:31        Scheduled Meds: .  stroke: mapping our early stages of recovery book   Does not apply Once  . amLODipine  5 mg Oral Daily  . atorvastatin  80 mg Oral q1800  . pneumococcal 23 valent vaccine  0.5 mL Intramuscular Tomorrow-1000   Continuous Infusions:    LOS: 1 day    Time spent: 25 minutes. Greater than 50% of this time was spent in direct contact with the patient coordinating care.     Chaya Jan, MD [facility] Pager [phone]  If 7PM-7AM, please contact night-coverage [email or web address] Password [facility] 06/09/2016, 4:39  PM    

## 2016-06-09 NOTE — Progress Notes (Addendum)
ANTICOAGULATION CONSULT NOTE - Initial Consult  Pharmacy Consult for Lovenox Indication: VTE prophylaxis  Allergies  Allergen Reactions  . Codeine Hives    Patient Measurements: Height: 5\' 2"  (157.5 cm) Weight: 202 lb 9.6 oz (91.9 kg) IBW/kg (Calculated) : 50.1  Vital Signs: Temp: 98 F (36.7 C) (10/08 0746) Temp Source: Oral (10/08 0746) BP: 147/77 (10/08 1358) Pulse Rate: 86 (10/08 1358)  Labs:  Recent Labs  06/08/16 1429 06/09/16 0724  HGB 15.8*  --   HCT 44.4  --   PLT 194  --   LABPROT 13.0  --   INR 0.99  --   CREATININE 0.86 0.88    Estimated Creatinine Clearance: 81.5 mL/min (by C-G formula based on SCr of 0.88 mg/dL).   Medical History: Past Medical History:  Diagnosis Date  . Hypertension     Assessment: BMI 37.1 CrCl >30 ml/min I Goal of Therapy: VTE prophylaxis    Plan:  Lovenox 45 mg SQ every 24 hours  Dana Ryan 06/09/2016,5:48 PM

## 2016-06-09 NOTE — Plan of Care (Signed)
Problem: Safety: Goal: Ability to remain free from injury will improve Outcome: Completed/Met Date Met: 06/09/16 Discussed need to use call bell if assistance needed, we don't want you to get up and fall, if blood pressure becomes elevated

## 2016-06-10 ENCOUNTER — Inpatient Hospital Stay (HOSPITAL_COMMUNITY): Payer: 59

## 2016-06-10 DIAGNOSIS — I639 Cerebral infarction, unspecified: Principal | ICD-10-CM

## 2016-06-10 LAB — LIPID PANEL
CHOL/HDL RATIO: 4.3 ratio
Cholesterol: 167 mg/dL (ref 0–200)
HDL: 39 mg/dL — AB (ref >40)
LDL Cholesterol: 111 mg/dL — ABNORMAL HIGH (ref 0–99)
Triglycerides: 86 mg/dL (ref <150)
VLDL: 17 mg/dL (ref 0–40)

## 2016-06-10 LAB — HEMOGLOBIN A1C
Hgb A1c MFr Bld: 6.9 % — ABNORMAL HIGH (ref 4.8–5.6)
Mean Plasma Glucose: 151 mg/dL

## 2016-06-10 MED ORDER — BISOPROLOL-HYDROCHLOROTHIAZIDE 5-6.25 MG PO TABS: 1.0000 | ORAL_TABLET | Freq: Every day | ORAL | 2 refills | Status: DC

## 2016-06-10 MED ORDER — ASPIRIN 81 MG PO TBEC: 81.0000 mg | DELAYED_RELEASE_TABLET | Freq: Every day | ORAL | Status: DC

## 2016-06-10 MED ORDER — LIVING WELL WITH DIABETES BOOK
Freq: Once | Status: DC
  Filled 2016-06-10 (×2): qty 1

## 2016-06-10 MED ORDER — ATORVASTATIN CALCIUM 80 MG PO TABS: 80.0000 mg | ORAL_TABLET | Freq: Every day | ORAL | 2 refills | Status: AC

## 2016-06-10 MED ORDER — BUPROPION HCL ER (SR) 100 MG PO TB12: 100.0000 mg | ORAL_TABLET | Freq: Two times a day (BID) | ORAL | 1 refills | Status: DC

## 2016-06-10 NOTE — Progress Notes (Addendum)
Inpatient Diabetes Program Recommendations  AACE/ADA: New Consensus Statement on Inpatient Glycemic Control (2015)  Target Ranges:  Prepandial:   less than 140 mg/dL      Peak postprandial:   less than 180 mg/dL (1-2 hours)      Critically ill patients:  140 - 180 mg/dL  Results for Dana Ryan, Dana Ryan (MRN [id number]) as of 06/10/2016 11:15  Ref. Range 06/08/2016 14:29 06/09/2016 07:24  Hemoglobin A1C Latest Ref Range: 4.8 - 5.6 %  6.9 (H)  Glucose Latest Ref Range: 65 - 99 mg/dL 113 (H) 135 (H)    Review of Glycemic Control  Diabetes history: No Outpatient Diabetes medications: NA Current orders for Inpatient glycemic control: None  Inpatient Diabetes Program Recommendations: HgbA1C: No prior history of diabetes. A1C 6.9% on 06/09/16. Per ADA if A1C is 6.5% or greater then criteria is met to diagnose with DM. MD, please indicate in note if patient will be diagnosed with DM at this time or if patient will be asked to follow up outpatient regarding glcyemic control. If patient is diagnosed with DM, please inform patient and nursing so bedside nursing can educate patient on DM.   Addendum 06/10/16'@14'$ :24- Spoke with patient about new diabetes diagnosis. Patient states that she is overwhelmed with news of being told she has diabetes and states that she will have to process everything going on with her health over the next few days. Patient states that she does not have any prior history of diabetes or of prediabetes but she has a sister with diabetes. Discussed A1C results (6.9% on 06/09/16)) and explained what an A1C is and informed patient that her current A1C indicates an average glucose of 151 mg/dl over the past 2-3 months. Discussed basic pathophysiology of DM Type 2 and importance of maintaining good CBG control to prevent long-term and short-term complications. Reviewed glucose and A1C goals and explained that patient will need to follow up with PCP. Patient currently does not have a PCP but has  insurance and intends to find a new PCP (previous PCP in [place] retired).  Discussed impact of nutrition, exercise, stress, sickness, and medications on diabetes control. Patient reports that she has been under a great deal of stress at work over the past 2 weeks and she understands that she will have to make dietary changes. Discussed carbohydrates, carbohydrate goals per day and meal, along with portion sizes. Informed patient that RD would also see her prior to discharge to provide further education on diet. Provided patient with handout information on free outpatient diabetes education class at [facility] and encouraged patient to attend the class.   Informed patient that the Living Well with diabetes booklet had been ordered from pharmacy and she would receive the booklet from the nurse. Encouraged patient to read through entire book to gain a better understanding of diabetes and importance of maintaining diabetes control.  Patient verbalized understanding of information discussed and she states that she has no further questions at this time related to diabetes.  Expect good compliance with dietary changes and lifestyle modifications.  RNs to provide ongoing basic DM education at bedside with this patient and engage patient to actively check blood glucose.   Thanks, Barnie Alderman, RN, MSN, CDE Diabetes Coordinator Inpatient Diabetes Program [phone] (Team Pager from [place] to [place]) [phone] ([facility] office) [phone] [facility] office) [phone] [facility] office)

## 2016-06-10 NOTE — Progress Notes (Signed)
Patient discharged with instructions, prescription, and care notes.  Verbalized understanding via teach back.  IV was removed and the site was WNL. Patient voiced no further complaints or concerns at the time of discharge.  Appointments scheduled per instructions.  Patient left the floor via w/c family  And staff in stable condition..  Diabetes books given.

## 2016-06-10 NOTE — Consult Note (Signed)
[place] A. Merlene Laughter, MD     [email or web address]          Dana Ryan is an 50 y.o. female.   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 Risk factors hypertension and age. Recommend the 162 mg of aspirin daily. Also blood pressure control. Follow-up MRI. Nicotine cessation and weight loss are also suggested. Will follow up in the hemoglobin A1c. The patient is to follow-up in the office with [place] in 1 month.      GENERAL: Pleasant obese female in no acute distress.  HEENT: Supple. Atraumatic normocephalic.   ABDOMEN: soft  EXTREMITIES: No edema   BACK: Normal.  SKIN: Normal by inspection.    MENTAL STATUS: Alert and oriented - although had some difficulties with her age and 20; was oriented to month. Speech - slightly dysarthric although they say this is not far from her baseline; language and cognition are generally intact. Judgment and insight normal.   CRANIAL NERVES: Pupils are equal, round and reactive to light and accommodation; extra ocular movements are full, there is no significant nystagmus; visual fields are full; upper and lower facial muscles are normal in strength and symmetric, there is subtle flattening of the left nasolabial fold; tongue is midline; uvula is midline; shoulder elevation is normal.  MOTOR: Normal tone, bulk and strength; no pronator drift. Good fine finger movements of the hands. There is no drift of the legs.   COORDINATION: Left finger to nose is normal, right finger to nose is normal, No rest tremor; no intention tremor; no postural tremor; no bradykinesia.  REFLEXES: Deep tendon reflexes are symmetrical and normal.   SENSATION: Normal to light touch. There is no extinction on double simultaneous stimulation both tactilely and visually.  GAIT: Normal.  NIH stroke scale 2.    Blood pressure (!) 164/93, pulse 65, temperature 98.1 F (36.7 C), temperature source Oral, resp. rate 18,  height '5\' 2"'$  (1.575 m), weight 202 lb 9.6 oz (91.9 kg), SpO2 98 %.  Past Medical History:  Diagnosis Date  . Hypertension     History reviewed. No pertinent surgical history.  Family History  Problem Relation Age of Onset  . Cancer Mother   . Hyperlipidemia Mother   . Cancer Father     Social History:  reports that she has been smoking Cigarettes.  She has a 20.00 pack-year smoking history. She has never used smokeless tobacco. She reports that she drinks alcohol. She reports that she does not use drugs.  Allergies:  Allergies  Allergen Reactions  . Codeine Hives    Medications: Prior to Admission medications   Medication Sig Start Date End Date Taking? Authorizing Provider  amLODipine (NORVASC) 5 MG tablet Take 5 mg by mouth daily.   Yes Historical Provider, MD  benazepril (LOTENSIN) 20 MG tablet Take 20 mg by mouth daily.   Yes Historical Provider, MD  bisoprolol-hydrochlorothiazide (ZIAC) 5-6.25 MG tablet Take 1 tablet by mouth daily.   Yes Historical Provider, MD    Scheduled Meds: .  stroke: mapping our early stages of recovery book   Does not apply Once  . amLODipine  5 mg Oral Daily  . aspirin EC  81 mg Oral Daily  . atorvastatin  80 mg Oral q1800  . enoxaparin (LOVENOX) injection  45 mg Subcutaneous Q24H  . pneumococcal 23 valent vaccine  0.5 mL Intramuscular Tomorrow-1000   Continuous Infusions:  PRN Meds:.acetaminophen, hydrALAZINE, nicotine     Results for orders placed or performed during the  hospital encounter of 06/08/16 (from the past 48 hour(s))  CBC with Differential/Platelet     Status: Abnormal   Collection Time: 06/08/16  2:29 PM  Result Value Ref Range   WBC 10.3 4.0 - 10.5 K/uL   RBC 5.01 3.87 - 5.11 MIL/uL   Hemoglobin 15.8 (H) 12.0 - 15.0 g/dL   HCT 25.4 98.2 - 64.1 %   MCV 88.6 78.0 - 100.0 fL   MCH 31.5 26.0 - 34.0 pg   MCHC 35.6 30.0 - 36.0 g/dL   RDW 58.3 09.4 - 07.6 %   Platelets 194 150 - 400 K/uL   Neutrophils Relative % 53 %     Neutro Abs 5.5 1.7 - 7.7 K/uL   Lymphocytes Relative 36 %   Lymphs Abs 3.8 0.7 - 4.0 K/uL   Monocytes Relative 7 %   Monocytes Absolute 0.8 0.1 - 1.0 K/uL   Eosinophils Relative 3 %   Eosinophils Absolute 0.3 0.0 - 0.7 K/uL   Basophils Relative 1 %   Basophils Absolute 0.1 0.0 - 0.1 K/uL  Comprehensive metabolic panel     Status: Abnormal   Collection Time: 06/08/16  2:29 PM  Result Value Ref Range   Sodium 140 135 - 145 mmol/L   Potassium 3.3 (L) 3.5 - 5.1 mmol/L   Chloride 105 101 - 111 mmol/L   CO2 27 22 - 32 mmol/L   Glucose, Bld 113 (H) 65 - 99 mg/dL   BUN 13 6 - 20 mg/dL   Creatinine, Ser 8.08 0.44 - 1.00 mg/dL   Calcium 9.5 8.9 - 81.1 mg/dL   Total Protein 7.6 6.5 - 8.1 g/dL   Albumin 4.3 3.5 - 5.0 g/dL   AST 26 15 - 41 U/L   ALT 24 14 - 54 U/L   Alkaline Phosphatase 106 38 - 126 U/L   Total Bilirubin 0.5 0.3 - 1.2 mg/dL   GFR calc non Af Amer >60 >60 mL/min   GFR calc Af Amer >60 >60 mL/min    Comment: (NOTE) The eGFR has been calculated using the CKD EPI equation. This calculation has not been validated in all clinical situations. eGFR's persistently <60 mL/min signify possible Chronic Kidney Disease.    Anion gap 8 5 - 15  Protime-INR     Status: None   Collection Time: 06/08/16  2:29 PM  Result Value Ref Range   Prothrombin Time 13.0 11.4 - 15.2 seconds   INR 0.99   Lipid panel     Status: Abnormal   Collection Time: 06/09/16  7:24 AM  Result Value Ref Range   Cholesterol 190 0 - 200 mg/dL   Triglycerides 93 <031 mg/dL   HDL 43 >59 mg/dL   Total CHOL/HDL Ratio 4.4 RATIO   VLDL 19 0 - 40 mg/dL   LDL Cholesterol 458 (H) 0 - 99 mg/dL    Comment:        Total Cholesterol/HDL:CHD Risk Coronary Heart Disease Risk Table                     Men   Women  1/2 Average Risk   3.4   3.3  Average Risk       5.0   4.4  2 X Average Risk   9.6   7.1  3 X Average Risk  23.4   11.0        Use the calculated Patient Ratio above and the CHD Risk Table to determine  the patient's CHD  Risk.        ATP III CLASSIFICATION (LDL):  <100     mg/dL   Optimal  100-129  mg/dL   Near or Above                    Optimal  130-159  mg/dL   Borderline  160-189  mg/dL   High  >190     mg/dL   Very High   Comprehensive metabolic panel     Status: Abnormal   Collection Time: 06/09/16  7:24 AM  Result Value Ref Range   Sodium 137 135 - 145 mmol/L   Potassium 3.8 3.5 - 5.1 mmol/L   Chloride 103 101 - 111 mmol/L   CO2 25 22 - 32 mmol/L   Glucose, Bld 135 (H) 65 - 99 mg/dL   BUN 12 6 - 20 mg/dL   Creatinine, Ser 0.88 0.44 - 1.00 mg/dL   Calcium 9.6 8.9 - 10.3 mg/dL   Total Protein 7.7 6.5 - 8.1 g/dL   Albumin 4.3 3.5 - 5.0 g/dL   AST 26 15 - 41 U/L   ALT 25 14 - 54 U/L   Alkaline Phosphatase 99 38 - 126 U/L   Total Bilirubin 0.7 0.3 - 1.2 mg/dL   GFR calc non Af Amer >60 >60 mL/min   GFR calc Af Amer >60 >60 mL/min    Comment: (NOTE) The eGFR has been calculated using the CKD EPI equation. This calculation has not been validated in all clinical situations. eGFR's persistently <60 mL/min signify possible Chronic Kidney Disease.    Anion gap 9 5 - 15    Studies/Results:     TTE - Left ventricle: The cavity size was normal. There was mild   concentric hypertrophy. Systolic function was normal. The   estimated ejection fraction was in the range of 55% to 60%. Wall   motion was normal; there were no regional wall motion   abnormalities. Doppler parameters are consistent with abnormal   left ventricular relaxation (grade 1 diastolic dysfunction).   Doppler parameters are consistent with elevated ventricular   end-diastolic filling pressure. - Aortic valve: Trileaflet; normal thickness leaflets. There was no   regurgitation. - Aortic root: The aortic root was normal in size. - Left atrium: The atrium was normal in size. - Right ventricle: Systolic function was normal. - Right atrium: The atrium was normal in size. - Tricuspid valve: There was no  regurgitation. - Pulmonic valve: There was no regurgitation. - Pulmonary arteries: Systolic pressure could not be accurately   estimated. - Inferior vena cava: The vessel was normal in size. - Pericardium, extracardiac: There was no pericardial effusion.     HEAD CT No CT evidence of acute intracranial abnormality.  Relatively dense appearance of the basilar artery, which is a nonspecific finding, however, if there were concern for acute neurologic problem and a possibility of thrombosis, CTA may be Considered.            CTA HEAD NECK IMPRESSION: No intracranial or extracranial flow reducing lesion of significance.  Special attention was directed to the CT finding of dense basilar artery, with no evidence for thrombosis.           Gurshan Settlemire A. Merlene Laughter, M.D.  Diplomate, Tax adviser of Psychiatry and Neurology ( Neurology). 06/10/2016, 7:14 AM

## 2016-06-10 NOTE — Patient Instructions (Signed)
Home Exercises Program Theraputty Exercises  Do the following exercises 2-3 times a day using your affected hand.  1. Roll putty into a ball.  2. Make into a pancake.  3. Roll putty into a roll.  4. Pinch along log with first finger and thumb.   5. Make into a ball.  6. Roll it back into a log.   7. Pinch using thumb and side of first finger.  8. Roll into a ball, then flatten into a pancake.  9. Using your fingers, make putty into a mountain. 

## 2016-06-10 NOTE — Discharge Summary (Signed)
Physician Discharge Summary  Dana Ryan:[id number] DOB: March 27, 1966 DOA: 06/08/2016  PCP: No PCP Per Patient  Admit date: 06/08/2016 Discharge date: 06/10/2016  Time spent: 45 minutes  Recommendations for Outpatient Follow-up:  -Will be discharged home today. -Advised to follow up with PCP in 2 weeks.   Discharge Diagnoses:  Principal Problem:   Acute CVA (cerebrovascular accident) [facility]) Active Problems:   Left-sided weakness   Hypertensive emergency   Hypokalemia   Hyperglycemia   Tobacco use   Prediabetes   Discharge Condition: Stable and improved  Filed Weights   06/08/16 1411 06/08/16 1915  Weight: 90.7 kg (200 lb) 91.9 kg (202 lb 9.6 oz)    History of present illness:  As per Dr. Selena Batten on 10/7: Dana Ryan  is a 50 y.o. female, with hypertension, (recently had bp medication changed), tobacco use,  w c/o left upper extremity and left lower ext weakness upon waking this am.  Pt states that her weakness is currently significantly better.  Pt denies headache, vision change, slurred speech, cp, palp, sob, numbness, tingling. Pt presented to the ED for evaluation.   In, ED, pt noted to be hypertensive and CT brain negative for CVA.  CTA neck no significant stenosis. Pt will be admitted for TIA ro CVA.    Hospital Course:   Left-sided weakness/Acute CVA -Initial CT head negative, MRIwithAn acute nonhemorrhagic right basal ganglia infarct -Continue aspirin for secondary stroke prevention. -Echo: Ejection fraction of 55-60% with no wall motion abnormalities and grade 1 diastolic dysfunction -CTA of the neck shows no intra-or extracranial flow reducing lesion of significance. -PT and ST have deferred evaluation given improved symptoms. -Patient advised on risk factor modification including tobacco cessation, improved management of hypertension, weight reduction, continued monitoring of her fasting glucose given her prediabetes status,  hyperlipidemia.  Hyperlipidemia -LDL confirmed at 111, given acute CVA and prediabetes will start on high intensity statin, atorvastatin 80 mg daily.  Tobacco abuse -Counseled on cessation especially in light of her multiple cardiovascular risk factors. -Patient will like to try Zyban for smoking cessation, will prescribe. She has had success with this in the past.  Prediabetes -A1c 6.4, will be seen by diabetes coordinator and dietitian prior to discharge, will need close monitoring of her glucose levels and A1c in the outpatient setting to determine if medication treatment will be warranted.  Procedures:  None   Consultations:  Neurology  Discharge Instructions  Discharge Instructions    Diet - low sodium heart healthy    Complete by:  As directed    Increase activity slowly    Complete by:  As directed       Allergies  Allergen Reactions  . Codeine Hives   Follow-up Information    new PCP. Schedule an appointment as soon as possible for a visit in 2 week(s).            The results of significant diagnostics from this hospitalization (including imaging, microbiology, ancillary and laboratory) are listed below for reference.    Significant Diagnostic Studies: Ct Angio Head W Or Wo Contrast  Result Date: 06/08/2016 CLINICAL DATA:  Patient awoke earlier today with LEFT-sided weakness. Imbalance and headache. EXAM: CT ANGIOGRAPHY HEAD AND NECK TECHNIQUE: Multidetector CT imaging of the head and neck was performed using the standard protocol during bolus administration of intravenous contrast. Multiplanar CT image reconstructions and MIPs were obtained to evaluate the vascular anatomy. Carotid stenosis measurements (when applicable) are obtained utilizing NASCET criteria, using the distal  internal carotid diameter as the denominator. CONTRAST:  Isovue 370, 75 mL. COMPARISON:  CT head earlier today. FINDINGS: CT HEAD Calvarium and skull base: No fracture or destructive  lesion. Mastoids and middle ears are grossly clear. Paranasal sinuses: Imaged portions are clear. Orbits: Negative. Brain: No evidence of acute abnormality, including acute infarct, hemorrhage, hydrocephalus, or mass lesion. CTA NECK Aortic arch: Normal variant branching, with direct origin of the LEFT vertebral from the arch. Imaged portion shows no evidence of aneurysm or dissection. No significant stenosis of the major arch vessel origins. Right carotid system: Minor atheromatous change. No evidence of dissection, stenosis (50% or greater) or occlusion. Left carotid system: Minor atheromatous change. No evidence of dissection, stenosis (50% or greater) or occlusion. Vertebral arteries: RIGHT vertebral dominant. No evidence of dissection, stenosis (50% or greater) or occlusion. Nonvascular soft tissues:  Unremarkable. Review of the MIP images confirms the above findings. CTA HEAD Anterior circulation: Minor cavernous calcification, insignificant. No significant stenosis, proximal occlusion, aneurysm, or vascular malformation. Posterior circulation: No evidence for basilar thrombosis. Appearance on CT was artifactual. Minor non stenotic calcification distal RIGHT vertebral artery. No significant stenosis, proximal occlusion, aneurysm, or vascular malformation. Venous sinuses: As permitted by contrast timing, patent. Anatomic variants: Hypoplastic A1 ACA, RIGHT. Delayed phase:   No abnormal intracranial enhancement. Review of the MIP images confirms the above findings. IMPRESSION: No intracranial or extracranial flow reducing lesion of significance. Special attention was directed to the CT finding of dense basilar artery, with no evidence for thrombosis. Electronically Signed   By: Elsie Stain M.D.   On: 06/08/2016 16:31   Dg Chest 2 View  Result Date: 06/08/2016 CLINICAL DATA:  TIA. EXAM: CHEST  2 VIEW COMPARISON:  None. FINDINGS: Cardiomediastinal silhouette is normal. Mediastinal contours appear intact.  There is no evidence of focal airspace consolidation, pleural effusion or pneumothorax. Osseous structures are without acute abnormality. Soft tissues are grossly normal. IMPRESSION: No active cardiopulmonary disease. Electronically Signed   By: Ted Mcalpine M.D.   On: 06/08/2016 21:42   Ct Head Wo Contrast  Result Date: 06/08/2016 CLINICAL DATA:  50 year old female with a history of left-sided weakness EXAM: CT HEAD WITHOUT CONTRAST TECHNIQUE: Contiguous axial images were obtained from the base of the skull through the vertex without intravenous contrast. COMPARISON:  None. FINDINGS: Brain: No acute intracranial hemorrhage. No midline shift or mass effect. Gray-white differentiation maintained. Unremarkable appearance of the ventricular system. Vascular: Calcifications of intracranial vasculature. Relatively dense appearance of the basilar artery. Skull: No acute fracture.  No aggressive bone lesion identified. Sinuses/Orbits: Unremarkable appearance of the orbits. Mastoid air cells clear. No middle ear effusion. No significant sinus disease. Other: None IMPRESSION: No CT evidence of acute intracranial abnormality. Relatively dense appearance of the basilar artery, which is a nonspecific finding, however, if there were concern for acute neurologic problem and a possibility of thrombosis, CTA may be considered. Signed, Yvone Neu. Loreta Ave, DO [facility] Electronically Signed   By: Gilmer Mor D.O.   On: 06/08/2016 15:17   Ct Angio Neck W And/or Wo Contrast  Result Date: 06/08/2016 CLINICAL DATA:  Patient awoke earlier today with LEFT-sided weakness. Imbalance and headache. EXAM: CT ANGIOGRAPHY HEAD AND NECK TECHNIQUE: Multidetector CT imaging of the head and neck was performed using the standard protocol during bolus administration of intravenous contrast. Multiplanar CT image reconstructions and MIPs were obtained to evaluate the vascular  anatomy. Carotid stenosis measurements (when applicable) are obtained utilizing NASCET criteria, using  the distal internal carotid diameter as the denominator. CONTRAST:  Isovue 370, 75 mL. COMPARISON:  CT head earlier today. FINDINGS: CT HEAD Calvarium and skull base: No fracture or destructive lesion. Mastoids and middle ears are grossly clear. Paranasal sinuses: Imaged portions are clear. Orbits: Negative. Brain: No evidence of acute abnormality, including acute infarct, hemorrhage, hydrocephalus, or mass lesion. CTA NECK Aortic arch: Normal variant branching, with direct origin of the LEFT vertebral from the arch. Imaged portion shows no evidence of aneurysm or dissection. No significant stenosis of the major arch vessel origins. Right carotid system: Minor atheromatous change. No evidence of dissection, stenosis (50% or greater) or occlusion. Left carotid system: Minor atheromatous change. No evidence of dissection, stenosis (50% or greater) or occlusion. Vertebral arteries: RIGHT vertebral dominant. No evidence of dissection, stenosis (50% or greater) or occlusion. Nonvascular soft tissues:  Unremarkable. Review of the MIP images confirms the above findings. CTA HEAD Anterior circulation: Minor cavernous calcification, insignificant. No significant stenosis, proximal occlusion, aneurysm, or vascular malformation. Posterior circulation: No evidence for basilar thrombosis. Appearance on CT was artifactual. Minor non stenotic calcification distal RIGHT vertebral artery. No significant stenosis, proximal occlusion, aneurysm, or vascular malformation. Venous sinuses: As permitted by contrast timing, patent. Anatomic variants: Hypoplastic A1 ACA, RIGHT. Delayed phase:   No abnormal intracranial enhancement. Review of the MIP images confirms the above findings. IMPRESSION: No intracranial or extracranial flow reducing lesion of significance. Special attention was directed to the CT finding of dense basilar artery,  with no evidence for thrombosis. Electronically Signed   By: Elsie StainJohn T Curnes M.D.   On: 06/08/2016 16:31   Mr Brain Wo Contrast  Result Date: 06/10/2016 CLINICAL DATA:  TIA with left-sided weakness and dizziness for 2 days. EXAM: MRI HEAD WITHOUT CONTRAST TECHNIQUE: Multiplanar, multiecho pulse sequences of the brain and surrounding structures were obtained without intravenous contrast. COMPARISON:  CTA head neck from 2 days ago. FINDINGS: Brain: Wedge-shaped area of restricted diffusion extending from the posterior right putamen across the corona radiata into the posterior caudate body. This is along the posterior margin of the lateral lenticulostriate distribution. No superimposed hemorrhage. No pre-existing ischemic injury. No mass, hydrocephalus, or swelling. Vascular: Normal flow voids. Skull and upper cervical spine: Normal marrow signal. Sinuses/Orbits: Minor mucosal thickening and mastoid air cells. IMPRESSION: 1. Acute, nonhemorrhagic right basal ganglia perforator infarct. 2. No pre-existing ischemic injury. Electronically Signed   By: Marnee SpringJonathon  Watts M.D.   On: 06/10/2016 08:45    Microbiology: No results found for this or any previous visit (from the past 240 hour(s)).   Labs: Basic Metabolic Panel:  Recent Labs Lab 06/08/16 1429 06/09/16 0724  NA 140 137  K 3.3* 3.8  CL 105 103  CO2 27 25  GLUCOSE 113* 135*  BUN 13 12  CREATININE 0.86 0.88  CALCIUM 9.5 9.6   Liver Function Tests:  Recent Labs Lab 06/08/16 1429 06/09/16 0724  AST 26 26  ALT 24 25  ALKPHOS 106 99  BILITOT 0.5 0.7  PROT 7.6 7.7  ALBUMIN 4.3 4.3   No results for input(s): LIPASE, AMYLASE in the last 168 hours. No results for input(s): AMMONIA in the last 168 hours. CBC:  Recent Labs Lab 06/08/16 1429  WBC 10.3  NEUTROABS 5.5  HGB 15.8*  HCT 44.4  MCV 88.6  PLT 194   Cardiac Enzymes: No results for input(s): CKTOTAL, CKMB, CKMBINDEX, TROPONINI in the last 168 hours. BNP: BNP (last 3  results) No results for input(s): BNP in the  last 8760 hours.  ProBNP (last 3 results) No results for input(s): PROBNP in the last 8760 hours.  CBG: No results for input(s): GLUCAP in the last 168 hours.     SignedChaya Jan  [facility] Pager: [phone] 06/10/2016, 1:27 PM

## 2016-06-10 NOTE — Evaluation (Signed)
Occupational Therapy Evaluation Patient Details Name: Dana Ryan MRN: [id number] DOB: 12/09/1965 Today's Date: 06/10/2016    History of Present Illness Dana Ryan  is a 50 y.o. female, with hypertension, (recently had bp medication changed), tobacco use,  w c/o left upper extremity and left lower ext weakness upon waking this am.  Pt states that her weakness is currently significantly better.  Pt denies headache, vision change, slurred speech, cp, palp, sob, numbness, tingling. Pt presented to the ED for evaluation. Patient was then admitted for further observation.   Clinical Impression   Patient presents with left hand decreased coordination x 2-3 days.  Patient educated on HEP for left hand strengthening and coordination.      Follow Up Recommendations  Outpatient OT (if patient feels she is not progressing with HEP)    Equipment Recommendations       Recommendations for Other Services Other (comment)     Precautions / Restrictions Precautions Precautions: None Restrictions Weight Bearing Restrictions: No      Mobility Bed Mobility                  Transfers                      Balance                                            ADL                                               Vision     Perception     Praxis      Pertinent Vitals/Pain Pain Assessment: No/denies pain     Hand Dominance Right   Extremity/Trunk Assessment Upper Extremity Assessment Upper Extremity Assessment: LUE deficits/detail LUE Deficits / Details: RUE A/ROM and strength is WNL.  LUE A/ROM is WNL.  LUE strength is 4/5 in shoulder, 5/5 in elbow, forearm, wrist, hand is 4-/5.   LUE Coordination: decreased fine motor (difficulty sewing and combing her hair)           Communication Communication Communication: No difficulties   Cognition Arousal/Alertness: Awake/alert Behavior During Therapy: WFL for tasks  assessed/performed Overall Cognitive Status: Within Functional Limits for tasks assessed                     General Comments       Exercises   Other Exercises Other Exercises: educated patient on HEP For left handed word typing, green mod resist theraputty: flatten, roll, grip, pinch, located pennies.    Shoulder Instructions      Home Living Family/patient expects to be discharged to:: Private residence Living Arrangements: Spouse/significant other Available Help at Discharge: Family Type of Home: House                                  Prior Functioning/Environment Level of Independence: Independent        Comments: working full time enjoys quilting, types 70 words per minute         OT Problem List: Decreased strength;Decreased coordination   OT Treatment/Interventions:      OT Goals(Current goals  can be found in the care plan section) Acute Rehab OT Goals Patient Stated Goal: I want to get back to typing 70 words per minute and quilting . OT Goal Formulation: With patient Time For Goal Achievement: 06/14/16 Potential to Achieve Goals: Good  OT Frequency:     Barriers to D/C:            Co-evaluation              End of Session    Activity Tolerance: Patient tolerated treatment well Patient left: in chair;with call bell/phone within reach;with family/visitor present   Time: 1610-9604 OT Time Calculation (min): 15 min Charges:  OT General Charges $OT Visit: 1 Procedure OT Evaluation $OT Eval Low Complexity: 1 Procedure OT Treatments $Self Care/Home Management : 8-22 mins G-Codes: OT G-codes **NOT FOR INPATIENT CLASS** Functional Limitation: Self care Self Care Current Status (V4098): At least 1 percent but less than 20 percent impaired, limited or restricted Self Care Goal Status (J1914): At least 1 percent but less than 20 percent impaired, limited or restricted Self Care Discharge Status [phone]): At least 1 percent but  less than 20 percent impaired, limited or restricted  Shirlean Mylar, MHA, OTR/L [phone]  06/10/2016, 10:27 AM

## 2016-06-10 NOTE — Care Management Note (Signed)
Case Management Note  Patient Details  Name: Dana Ryan MRN: [id number] Date of Birth: 1965/12/29    Expected Discharge Date:      06/10/2016            Expected Discharge Plan:  Home/Self Care  In-House Referral:  NA  Discharge planning Services  CM Consult  Post Acute Care Choice:  NA Choice offered to:  NA  DME Arranged:    DME Agency:     HH Arranged:    HH Agency:     Status of Service:  Completed, signed off  If discussed at [employer] Length of Stay Meetings, dates discussed:    Additional Comments: Patient discharging today. She is ind with ADL's. She does not have a PCP, list of providers given to patient. She has insurance, reports no issues affording medications. Giulianna Rocha, Chrystine OilerSharley Diane, RN 06/10/2016, 1:42 PM

## 2017-07-29 ENCOUNTER — Telehealth: Payer: Self-pay | Admitting: *Deleted

## 2017-07-29 ENCOUNTER — Encounter: Payer: Self-pay | Admitting: Family Medicine

## 2017-07-29 ENCOUNTER — Other Ambulatory Visit: Payer: Self-pay

## 2017-07-29 ENCOUNTER — Ambulatory Visit (INDEPENDENT_AMBULATORY_CARE_PROVIDER_SITE_OTHER): Payer: 59 | Admitting: Family Medicine

## 2017-07-29 VITALS — BP 180/100 | HR 64 | Temp 97.8°F | Resp 16 | Ht 60.0 in | Wt 205.1 lb

## 2017-07-29 DIAGNOSIS — Z1231 Encounter for screening mammogram for malignant neoplasm of breast: Secondary | ICD-10-CM

## 2017-07-29 DIAGNOSIS — Z720 Tobacco use: Secondary | ICD-10-CM | POA: Diagnosis not present

## 2017-07-29 DIAGNOSIS — Z780 Asymptomatic menopausal state: Secondary | ICD-10-CM | POA: Diagnosis not present

## 2017-07-29 DIAGNOSIS — I639 Cerebral infarction, unspecified: Secondary | ICD-10-CM

## 2017-07-29 DIAGNOSIS — I10 Essential (primary) hypertension: Secondary | ICD-10-CM | POA: Diagnosis not present

## 2017-07-29 DIAGNOSIS — E119 Type 2 diabetes mellitus without complications: Secondary | ICD-10-CM | POA: Diagnosis not present

## 2017-07-29 DIAGNOSIS — Z1239 Encounter for other screening for malignant neoplasm of breast: Secondary | ICD-10-CM

## 2017-07-29 MED ORDER — VARENICLINE TARTRATE 0.5 MG X 11 & 1 MG X 42 PO MISC: ORAL | 0 refills | Status: DC

## 2017-07-29 NOTE — Progress Notes (Signed)
Chief Complaint  Patient presents with  . Hypertension   This is a new patient to establish.  No old records are available for review. She is a smoker but interested in quitting.  We discussed the various methods of assisting her with quitting smoking.  She desires Chantix.  Pros cons and benefits, side effects are discussed.  She is given a prescription for starter pack and will come back in 1 month. She has hypertension.  Not well controlled.  She has not taken her medicine today.  It is 180/100.  Risks of untreated hypertension are discussed with her.  Importance of compliance with daily medication is discussed with her. She has diabetes.  She is supposed to take metformin 2 pills twice a day.  She is not taking this because it makes her sick to her stomach.  She states she is only been diabetic for couple of months.  She has not gone for any diabetes education.  She does not have any known diabetes side effects.  Diabetes does run in her family. She was hospitalized last October for an acute CVA?  TIA.  She does not have any residual.  Her symptoms did not go away in 24 hours with a did resolve.  She states that this was her "wakeup call".  I told her that perhaps at the time, she was compliant with recommendations, but she currently is not.  In order to prevent another neurologic event she must control her cholesterol, control blood pressure, control her diabetes, stop smoking, lose weight, and exercise. Patient has hyperlipidemia and takes Lipitor 80 mg a day She does not exercise.  She is a BMI of 40.  We discussed that her obesity is a big issue when you have comorbidities as hypertension diabetes TIA.  She agrees to go see a diabetes educator/nutritionist. She has breast cancer in her family and and has not had a mammogram in years. She went into menopause in her late 7730s.  She has never had a DEXA scan.  It has been 20 years. She has never had colon cancer screening.  She turned 51 in  April.  We discussed the various forms of colon cancer screening and she agrees to cologuard.  Patient Active Problem List   Diagnosis Date Noted  . Essential hypertension 07/29/2017  . Controlled diabetes mellitus type II without complication (HCC) 07/29/2017  . Morbid obesity (HCC) 07/29/2017  . Tobacco use 06/08/2016  . Acute CVA (cerebrovascular accident) (HCC) 06/08/2016    Outpatient Encounter Medications as of 07/29/2017  Medication Sig  . aspirin EC 81 MG EC tablet Take 1 tablet (81 mg total) by mouth daily.  Marland Kitchen. atorvastatin (LIPITOR) 80 MG tablet Take 1 tablet (80 mg total) by mouth daily at 6 PM.  . bisoprolol-hydrochlorothiazide (ZIAC) 5-6.25 MG tablet Take 1 tablet by mouth daily.  Marland Kitchen. buPROPion (WELLBUTRIN SR) 100 MG 12 hr tablet Take 1 tablet (100 mg total) by mouth 2 (two) times daily.  . hydrochlorothiazide (HYDRODIURIL) 12.5 MG tablet   . metFORMIN (GLUCOPHAGE-XR) 500 MG 24 hr tablet Take 500 mg by mouth daily at 8 pm.  . varenicline (CHANTIX STARTING MONTH PAK) 0.5 MG X 11 & 1 MG X 42 tablet Take one 0.5 mg tablet by mouth once daily for 3 days, then increase to one 0.5 mg tablet twice daily for 4 days, then increase to one 1 mg tablet twice daily.   No facility-administered encounter medications on file as of 07/29/2017.  Past Medical History:  Diagnosis Date  . Allergy   . Diabetes mellitus without complication (HCC)   . Heart murmur   . Hyperlipidemia   . Hypertension   . Hypertensive emergency 06/08/2016  . Left-sided weakness 06/08/2016  . Stroke [facility](HCC)     History reviewed. No pertinent surgical history.  Social History   Socioeconomic History  . Marital status: Married    Spouse name: Casimiro NeedleMichael  . Number of children: 0  . Years of education: 4916  . Highest education level: Associate degree: academic program  Social Needs  . Financial resource strain: Not hard at all  . Food insecurity - worry: Never true  . Food insecurity - inability: Never true  .  Transportation needs - medical: No  . Transportation needs - non-medical: No  Occupational History  . Occupation: admin asst  Tobacco Use  . Smoking status: Current Some Day Smoker    Packs/day: 1.00    Years: 20.00    Pack years: 20.00    Types: Cigarettes    Start date: 09/02/1989  . Smokeless tobacco: Never Used  . Tobacco comment: 1 ppd  Substance and Sexual Activity  . Alcohol use: Yes    Alcohol/week: 0.0 oz    Comment: rarely  . Drug use: No  . Sexual activity: Yes    Birth control/protection: Post-menopausal  Other Topics Concern  . Not on file  Social History Narrative   Associate degree in visual communication    Family History  Problem Relation Age of Onset  . Cancer Mother        breast  . Hyperlipidemia Mother   . Arthritis Mother   . Heart disease Mother   . Hypertension Mother   . Miscarriages / [place] Mother   . Cancer Father        throat  . Hypertension Father   . Heart disease Father        aneurysm on heart  . Diabetes Sister   . Heart disease Maternal Grandmother   . Hyperlipidemia Maternal Grandmother   . Hypertension Maternal Grandmother   . Stroke Maternal Grandfather     Review of Systems  Constitutional: Negative for chills, fever and weight loss.  HENT: Negative for congestion and hearing loss.   Eyes: Negative for blurred vision and pain.  Respiratory: Negative for cough and shortness of breath.   Cardiovascular: Negative for chest pain and leg swelling.  Gastrointestinal: Negative for abdominal pain, constipation, diarrhea and heartburn.  Genitourinary: Positive for frequency. Negative for dysuria.       When sugar high  Musculoskeletal: Negative for falls, joint pain and myalgias.  Neurological: Positive for headaches. Negative for dizziness and seizures.       Blood pressure high  Psychiatric/Behavioral: Negative for depression. The patient is not nervous/anxious and does not have insomnia.     BP (!) 180/100 (BP  Location: Right Arm, Patient Position: Sitting, Cuff Size: Normal)   Pulse 64   Temp 97.8 F (36.6 C) (Temporal)   Resp 16   Ht 5' (1.524 m)   Wt 205 lb 1.9 oz (93 kg)   SpO2 98%   BMI 40.06 kg/m   Physical Exam  Constitutional: She is oriented to person, place, and time. She appears well-developed and well-nourished.  HENT:  Head: Normocephalic and atraumatic.  Mouth/Throat: Oropharynx is clear and moist.  Eyes: Conjunctivae are normal. Pupils are equal, round, and reactive to light.  Neck: Normal range of motion. Neck supple. No thyromegaly  present.  Cardiovascular: Normal rate, regular rhythm and normal heart sounds.  Pulmonary/Chest: Effort normal and breath sounds normal. No respiratory distress.  Musculoskeletal: Normal range of motion. She exhibits no edema.  Lymphadenopathy:    She has no cervical adenopathy.  Neurological: She is alert and oriented to person, place, and time.  Gait normal  Psychiatric: She has a normal mood and affect. Her behavior is normal. Thought content normal.  Nursing note and vitals reviewed. ASSESSMENT/PLAN:   1. Essential hypertension Noncompliant.  Elevated today  2. Controlled type 2 diabetes mellitus without complication, without long-term current use of insulin (HCC) Noncompliant, not taking metformin.  Not on diet  3. Morbid obesity (HCC) Discussed diet and exercise and to the risk for increased medical complications because of her weight  4. Acute CVA (cerebrovascular accident) [facility]) Discussed prevention  5. Tobacco use Discussed quitting.  Patient agrees to try Chantix.  She states her Wellbutrin is not working.  I am going to add Chantix and see her in 1 month  6. Screening for breast cancer Overdue - MM Digital Screening; Future  7. Post-menopausal Overdue - DG Bone Density; Future   Patient Instructions  Need old records Need recent labs Try to get more exercise Due for mammogram Reduce the metformin to one a day  with breakfast Try to quit smoking I will prescribe chantix START SMOKING OUTSIDE See me in a month Take the blood pressure pill AND the metformin in the morning with a little food ( boiled egg, cheese toast, whatever)    Eustace Moore, MD

## 2017-07-29 NOTE — Patient Instructions (Addendum)
Need old records Need recent labs Try to get more exercise Due for mammogram Reduce the metformin to one a day with breakfast Try to quit smoking I will prescribe chantix START SMOKING OUTSIDE See me in a month Take the blood pressure pill AND the metformin in the morning with a little food ( boiled egg, cheese toast, whatever)

## 2017-07-29 NOTE — Telephone Encounter (Signed)
[facility] called and [facility] [place] Please advise. Thank you

## 2017-09-08 ENCOUNTER — Encounter: Payer: Self-pay | Admitting: Family Medicine

## 2017-09-08 ENCOUNTER — Ambulatory Visit (INDEPENDENT_AMBULATORY_CARE_PROVIDER_SITE_OTHER): Payer: 59 | Admitting: Family Medicine

## 2017-09-08 ENCOUNTER — Other Ambulatory Visit: Payer: Self-pay

## 2017-09-08 ENCOUNTER — Other Ambulatory Visit: Payer: Self-pay | Admitting: Family Medicine

## 2017-09-08 VITALS — BP 140/80 | HR 67 | Temp 98.1°F | Resp 16 | Ht 60.0 in | Wt 201.2 lb

## 2017-09-08 DIAGNOSIS — E785 Hyperlipidemia, unspecified: Secondary | ICD-10-CM

## 2017-09-08 DIAGNOSIS — I10 Essential (primary) hypertension: Secondary | ICD-10-CM

## 2017-09-08 DIAGNOSIS — E119 Type 2 diabetes mellitus without complications: Secondary | ICD-10-CM

## 2017-09-08 DIAGNOSIS — Z1231 Encounter for screening mammogram for malignant neoplasm of breast: Secondary | ICD-10-CM | POA: Diagnosis not present

## 2017-09-08 DIAGNOSIS — Z720 Tobacco use: Secondary | ICD-10-CM | POA: Diagnosis not present

## 2017-09-08 DIAGNOSIS — Z1239 Encounter for other screening for malignant neoplasm of breast: Secondary | ICD-10-CM

## 2017-09-08 DIAGNOSIS — Z1211 Encounter for screening for malignant neoplasm of colon: Secondary | ICD-10-CM

## 2017-09-08 NOTE — Patient Instructions (Addendum)
Need mammogram and DEXA Need cologuard ( sign form) Due for lab testing end of Jan I will send you a letter with your test results.  If there is anything of concern, we will call right away. As we discussed last visit, I recommend you see a diabetes educator  See me in 3 months Walk every day that you are able

## 2017-09-08 NOTE — Progress Notes (Signed)
Chief Complaint  Patient presents with  . Follow-up   This is a new patient here for her first follow-up.  I have not yet received her old records to update her health maintenance.  She is taking her medicine regularly since her last visit.  She is due for blood work the end of this month, her last hemoglobin A1c on October was 8.1.  She does not check her sugar at home.  She does not get regular exercise. She is overdue for mammogram and DEXA scan testing.  These were ordered at her last visit.  She is reminded to get these done.  She has never had colon cancer screening.  At her last visit we discussed cologuard testing.  I will order this for her today. Her last LDL was quite low at 44.  She is compliant with Lipitor.  Her blood pressure is better today.  She took her medicine this morning. She did not stop smoking.  She states her insurance company would not authorize the Chantix.  Patient Active Problem List   Diagnosis Date Noted  . HLD (hyperlipidemia) 09/08/2017  . Essential hypertension 07/29/2017  . Controlled diabetes mellitus type II without complication (HCC) 07/29/2017  . Morbid obesity (HCC) 07/29/2017  . Tobacco use 06/08/2016  . Acute CVA (cerebrovascular accident) (HCC) 06/08/2016    Outpatient Encounter Medications as of 09/08/2017  Medication Sig  . aspirin EC 81 MG EC tablet Take 1 tablet (81 mg total) by mouth daily.  Marland Kitchen. atorvastatin (LIPITOR) 80 MG tablet Take 1 tablet (80 mg total) by mouth daily at 6 PM.  . bisoprolol-hydrochlorothiazide (ZIAC) 5-6.25 MG tablet Take 1 tablet by mouth daily.  Marland Kitchen. buPROPion (WELLBUTRIN SR) 100 MG 12 hr tablet Take 1 tablet (100 mg total) by mouth 2 (two) times daily.  . hydrochlorothiazide (HYDRODIURIL) 12.5 MG tablet   . metFORMIN (GLUCOPHAGE-XR) 500 MG 24 hr tablet Take 500 mg by mouth daily at 8 pm.  . varenicline (CHANTIX STARTING MONTH PAK) 0.5 MG X 11 & 1 MG X 42 tablet Take one 0.5 mg tablet by mouth once daily for 3 days,  then increase to one 0.5 mg tablet twice daily for 4 days, then increase to one 1 mg tablet twice daily.   No facility-administered encounter medications on file as of 09/08/2017.     Allergies  Allergen Reactions  . Codeine Hives    Raised red itchy bites    Review of Systems  Constitutional: Negative for activity change, appetite change and unexpected weight change.  HENT: Negative.  Negative for congestion and dental problem.   Eyes: Negative for photophobia and visual disturbance.  Respiratory: Negative for cough and choking.   Cardiovascular: Negative for chest pain and leg swelling.  Gastrointestinal: Negative for constipation and diarrhea.  Endocrine: Positive for polydipsia.       Patient complains of thirst  Genitourinary: Negative for difficulty urinating and frequency.  Musculoskeletal: Negative for back pain and gait problem.  Neurological: Negative for dizziness and headaches.  Psychiatric/Behavioral: Negative for sleep disturbance. The patient is not nervous/anxious.     BP 140/80 (BP Location: Left Arm, Patient Position: Sitting, Cuff Size: Normal)   Pulse 67   Temp 98.1 F (36.7 C) (Other (Comment))   Resp 16   Ht 5' (1.524 m)   Wt 201 lb 4 oz (91.3 kg)   SpO2 97%   BMI 39.30 kg/m   Physical Exam  Constitutional: She is oriented to person, place, and time.  She appears well-developed and well-nourished. No distress.  HENT:  Head: Normocephalic and atraumatic.  Eyes: Conjunctivae are normal. Pupils are equal, round, and reactive to light.  Neck: Normal range of motion. Neck supple.  Cardiovascular: Normal rate, regular rhythm and normal heart sounds.  Pulmonary/Chest: Effort normal and breath sounds normal.  Lymphadenopathy:    She has no cervical adenopathy.  Neurological: She is alert and oriented to person, place, and time.  Psychiatric: She has a normal mood and affect. Her behavior is normal.    ASSESSMENT/PLAN:  1. Hyperlipidemia, unspecified  hyperlipidemia type   2. Screening for breast cancer   3. Controlled type 2 diabetes mellitus without complication, without long-term current use of insulin (HCC)  - CBC - COMPLETE METABOLIC PANEL WITH GFR - Hemoglobin A1c - Lipid panel - Microalbumin / creatinine urine ratio - Ambulatory referral to diabetic education  4. Essential hypertension   5. Screen for colon cancer  - Cologuard  6. Tobacco use    Patient Instructions  Need mammogram and DEXA Need cologuard ( sign form) Due for lab testing end of Jan I will send you a letter with your test results.  If there is anything of concern, we will call right away. As we discussed last visit, I recommend you see a diabetes educator  See me in 3 months Walk every day that you are able      Eustace Moore, MD

## 2017-09-18 ENCOUNTER — Ambulatory Visit (HOSPITAL_COMMUNITY)
Admission: RE | Admit: 2017-09-18 | Discharge: 2017-09-18 | Disposition: A | Discharge status: Home / Self Care | Payer: 59 | Source: Ambulatory Visit | Attending: Family Medicine | Admitting: Family Medicine

## 2017-09-18 ENCOUNTER — Other Ambulatory Visit: Payer: Self-pay | Admitting: Family Medicine

## 2017-09-18 DIAGNOSIS — M85851 Other specified disorders of bone density and structure, right thigh: Secondary | ICD-10-CM | POA: Diagnosis not present

## 2017-09-18 DIAGNOSIS — Z780 Asymptomatic menopausal state: Secondary | ICD-10-CM | POA: Insufficient documentation

## 2017-09-18 DIAGNOSIS — F17200 Nicotine dependence, unspecified, uncomplicated: Secondary | ICD-10-CM | POA: Diagnosis not present

## 2017-09-18 DIAGNOSIS — M8588 Other specified disorders of bone density and structure, other site: Secondary | ICD-10-CM | POA: Insufficient documentation

## 2017-09-18 DIAGNOSIS — Z1231 Encounter for screening mammogram for malignant neoplasm of breast: Secondary | ICD-10-CM | POA: Insufficient documentation

## 2017-09-18 DIAGNOSIS — R921 Mammographic calcification found on diagnostic imaging of breast: Secondary | ICD-10-CM | POA: Diagnosis not present

## 2017-09-18 DIAGNOSIS — R928 Other abnormal and inconclusive findings on diagnostic imaging of breast: Secondary | ICD-10-CM | POA: Insufficient documentation

## 2017-09-22 ENCOUNTER — Other Ambulatory Visit: Payer: Self-pay | Admitting: Family Medicine

## 2017-09-22 ENCOUNTER — Telehealth: Payer: Self-pay | Admitting: Family Medicine

## 2017-09-22 MED ORDER — BUPROPION HCL ER (SR) 150 MG PO TB12: 150.0000 mg | ORAL_TABLET | Freq: Two times a day (BID) | ORAL | 3 refills | Status: DC

## 2017-09-22 NOTE — Telephone Encounter (Signed)
I sent into the pharmacy with a note that she prefers name brand

## 2017-09-22 NOTE — Telephone Encounter (Signed)
Pt is calling in requesting Zyban __Please note, she DOES NOT WANT GENERIC

## 2017-09-24 ENCOUNTER — Telehealth: Payer: Self-pay | Admitting: Family Medicine

## 2017-09-24 ENCOUNTER — Encounter: Payer: Self-pay | Admitting: Family Medicine

## 2017-09-24 ENCOUNTER — Encounter: Payer: Self-pay | Admitting: Nutrition

## 2017-09-24 ENCOUNTER — Encounter: Payer: 59 | Attending: Family Medicine | Admitting: Nutrition

## 2017-09-24 VITALS — Ht 61.5 in | Wt 203.0 lb

## 2017-09-24 DIAGNOSIS — E118 Type 2 diabetes mellitus with unspecified complications: Secondary | ICD-10-CM

## 2017-09-24 DIAGNOSIS — I10 Essential (primary) hypertension: Secondary | ICD-10-CM

## 2017-09-24 DIAGNOSIS — E669 Obesity, unspecified: Secondary | ICD-10-CM

## 2017-09-24 DIAGNOSIS — Z713 Dietary counseling and surveillance: Secondary | ICD-10-CM | POA: Diagnosis present

## 2017-09-24 DIAGNOSIS — E119 Type 2 diabetes mellitus without complications: Secondary | ICD-10-CM | POA: Diagnosis present

## 2017-09-24 DIAGNOSIS — Z720 Tobacco use: Secondary | ICD-10-CM

## 2017-09-24 DIAGNOSIS — IMO0002 Reserved for concepts with insufficient information to code with codable children: Secondary | ICD-10-CM

## 2017-09-24 DIAGNOSIS — I639 Cerebral infarction, unspecified: Secondary | ICD-10-CM

## 2017-09-24 DIAGNOSIS — E1165 Type 2 diabetes mellitus with hyperglycemia: Secondary | ICD-10-CM

## 2017-09-24 DIAGNOSIS — E785 Hyperlipidemia, unspecified: Secondary | ICD-10-CM

## 2017-09-24 NOTE — Progress Notes (Signed)
Diabetes Self-Management Education  Visit Type: First/Initial  Appt. Start Time: 0800 Appt. End Time: 0930  09/24/2017  Dana Ryan, identified by name and date of birth, is a 52 y.o. female with a diagnosis of Diabetes: Type 2.  H/0 CVA- Tia? PMH: Mixed lipidemia, HTN and overweight. LIves with her husband. Has had DM for a few years but never has meet with an RDN/CDE before to know what to do. Not testing blood sugars. Doesn't have a meter.  Eats 2-3 meals per day. Skips breakfast. Not exercising. Works full time. Doesn't get home til late and eats dinner later.       Is on Metformin 500 mg daily. Smokes and has quit three times per before. Doesn't want to use Wellbutrin. Can't afford Chantix. Referred her to [facility] for smoking cessation program.    Current diet is insuffient to meet her needs. She has been trying to follow a KETO diet and not eating a lot of vegetables. Does eat fruit. Meal times are off schedule.   She iGave and taughter her how to use a One Touch Meter. Needs to call PCP to get prescription for testing supplies.s willing and enaged to improve her DM and reduce risk of complications from DM/  ASSESSMENT  Height 5' 1.5" (1.562 m), weight 203 lb (92.1 kg). Body mass index is 37.74 kg/m.  Diabetes Self-Management Education - 09/24/17 0803      Visit Information   Visit Type  First/Initial      Initial Visit   Diabetes Type  Type 2    Are you currently following a meal plan?  No    Are you taking your medications as prescribed?  Yes    Date Diagnosed  2017      Health Coping   How would you rate your overall health?  Good      Psychosocial Assessment   Patient Belief/Attitude about Diabetes  Motivated to manage diabetes    Self-care barriers  None    Self-management support  Doctor's office;Family    Other persons present  Patient    Patient Concerns  Nutrition/Meal planning;Medication;Monitoring;Healthy Lifestyle;Problem Solving;Glycemic Control;Weight  Control    Special Needs  None    Preferred Learning Style  No preference indicated    Learning Readiness  Change in progress    How often do you need to have someone help you when you read instructions, pamphlets, or other written materials from your doctor or pharmacy?  1 - Never    What is the last grade level you completed in school?  12      Pre-Education Assessment   Patient understands the diabetes disease and treatment process.  Needs Instruction    Patient understands incorporating nutritional management into lifestyle.  Needs Instruction    Patient undertands incorporating physical activity into lifestyle.  Needs Instruction    Patient understands using medications safely.  Needs Instruction    Patient understands monitoring blood glucose, interpreting and using results  Needs Instruction    Patient understands prevention, detection, and treatment of acute complications.  Needs Instruction    Patient understands prevention, detection, and treatment of chronic complications.  Needs Instruction    Patient understands how to develop strategies to address psychosocial issues.  Needs Instruction    Patient understands how to develop strategies to promote health/change behavior.  Needs Instruction      Complications   Last HgB A1C per patient/outside source  8.1 %    How often do you  check your blood sugar?  0 times/day (not testing)    Have you had a dilated eye exam in the past 12 months?  Yes    Have you had a dental exam in the past 12 months?  Yes    Are you checking your feet?  Yes    How many days per week are you checking your feet?  7      Dietary Intake   Breakfast  skips and coffee or fruit smoothie or bagel or english muffin    Lunch  Toss salad-chicken, lettuce, veggies with ranch dressing, REd Bull     Dinner  Steak and mushrooms and potato, Diet Dr. Reino Kent    Beverage(s)  water, red bull,      Exercise   Exercise Type  ADL's      Patient Education   Disease  state   Definition of diabetes, type 1 and 2, and the diagnosis of diabetes;Factors that contribute to the development of diabetes    Nutrition management   Role of diet in the treatment of diabetes and the relationship between the three main macronutrients and blood glucose level    Physical activity and exercise   Role of exercise on diabetes management, blood pressure control and cardiac health.    Medications  Reviewed patients medication for diabetes, action, purpose, timing of dose and side effects.;Reviewed medication adjustment guidelines for hyperglycemia and sick days.    Monitoring  Taught/evaluated SMBG meter.;Purpose and frequency of SMBG.    Acute complications  Trained/discussed glucagon administration to patient and designated other.;Taught treatment of hypoglycemia - the 15 rule.    Chronic complications  Relationship between chronic complications and blood glucose control    Psychosocial adjustment  Worked with patient to identify barriers to care and solutions;Role of stress on diabetes    Personal strategies to promote health  Lifestyle issues that need to be addressed for better diabetes care      Individualized Goals (developed by patient)   Nutrition  Follow meal plan discussed    Physical Activity  Exercise 3-5 times per week;30 minutes per day    Monitoring   test my blood glucose as discussed    Reducing Risk  stop smoking;get labs drawn;do foot checks daily;examine blood glucose patterns      Post-Education Assessment   Patient understands the diabetes disease and treatment process.  Needs Review    Patient understands incorporating nutritional management into lifestyle.  Needs Review    Patient undertands incorporating physical activity into lifestyle.  Needs Review    Patient understands using medications safely.  Needs Review    Patient understands monitoring blood glucose, interpreting and using results  Needs Review    Patient understands prevention, detection,  and treatment of acute complications.  Needs Review    Patient understands prevention, detection, and treatment of chronic complications.  Needs Review    Patient understands how to develop strategies to address psychosocial issues.  Needs Review    Patient understands how to develop strategies to promote health/change behavior.  Needs Review      Outcomes   Expected Outcomes  Demonstrated interest in learning. Expect positive outcomes    Future DMSE  4-6 wks    Program Status  Completed       Individualized Plan for Diabetes Self-Management Training:   Learning Objective:  Patient will have a greater understanding of diabetes self-management. Patient education plan is to attend individual and/or group sessions per assessed needs and concerns.  Plan:   Patient Instructions  Goals 1. Follow My Plate 2. Eat three meals per day. Eat 2-3 carb choice per meal 3. [facility] about smoking cessation classes. 4. Quit smoking 5. Work on timing of meals. Test blood sugars 1-2 times per day.     Expected Outcomes:  Demonstrated interest in learning. Expect positive outcomes  Education material provided: Living Well with Diabetes, Food label handouts, A1C conversion sheet, Meal plan card, My Plate and Carbohydrate counting sheet  If problems or questions, patient to contact team via:  Phone and Email  Future DSME appointment: 4-6 wks  Ref[facility] to [facility]essation Program to quit smoking.

## 2017-09-24 NOTE — Patient Instructions (Signed)
Goals 1. Follow My Plate 2. Eat three meals per day. Eat 2-3 carb choice per meal 3. Contact [facility] about smoking cessation classes. 4. Quit smoking 5. Work on timing of meals. Test blood sugars 1-2 times per day.

## 2017-09-24 NOTE — Telephone Encounter (Signed)
Patient seen Dr.Crumpton today , they recommended she get a glucose kit/ strips and supplies. Her insurance requires pre auth. She uses [facility] but insurance gave her a list of pharmacies to use. She is requesting a call back for a better understanding of her situation Cb#: [phone]

## 2017-09-24 NOTE — Telephone Encounter (Signed)
Spoke to patient. I informed her that she will need a prescription for the meter and that Dr. Delton SeeNelson will send that in after she reviews the note from Ms. Crumpton if she feels that is the best course of action. I offered a free meter from our sample stock and she states she already has one. She states that her insurance told her she can use [facility], but I told her we would send to [facility] first and then go from there if it is not covered. [facility] One Touch Ultra, but we can send in as generic and let pharmacy fill what is covered just in case.

## 2017-09-30 ENCOUNTER — Ambulatory Visit (HOSPITAL_COMMUNITY)
Admission: RE | Admit: 2017-09-30 | Discharge: 2017-09-30 | Disposition: A | Discharge status: Home / Self Care | Payer: 59 | Source: Ambulatory Visit | Attending: Family Medicine | Admitting: Family Medicine

## 2017-09-30 DIAGNOSIS — R921 Mammographic calcification found on diagnostic imaging of breast: Secondary | ICD-10-CM | POA: Insufficient documentation

## 2017-10-01 ENCOUNTER — Encounter: Payer: Self-pay | Admitting: Family Medicine

## 2017-10-01 LAB — CBC
HCT: 46.6 % — ABNORMAL HIGH (ref 35.0–45.0)
HEMOGLOBIN: 16.5 g/dL — AB (ref 11.7–15.5)
MCH: 31 pg (ref 27.0–33.0)
MCHC: 35.4 g/dL (ref 32.0–36.0)
MCV: 87.4 fL (ref 80.0–100.0)
MPV: 11.8 fL (ref 7.5–12.5)
Platelets: 239 10*3/uL (ref 140–400)
RBC: 5.33 10*6/uL — AB (ref 3.80–5.10)
RDW: 11.3 % (ref 11.0–15.0)
WBC: 11.5 10*3/uL — AB (ref 3.8–10.8)

## 2017-10-01 LAB — COMPLETE METABOLIC PANEL WITH GFR
AG RATIO: 1.5 (calc) (ref 1.0–2.5)
ALBUMIN MSPROF: 4.4 g/dL (ref 3.6–5.1)
ALKALINE PHOSPHATASE (APISO): 90 U/L (ref 33–130)
ALT: 27 U/L (ref 6–29)
AST: 25 U/L (ref 10–35)
BILIRUBIN TOTAL: 0.6 mg/dL (ref 0.2–1.2)
BUN / CREAT RATIO: 18 (calc) (ref 6–22)
BUN: 21 mg/dL (ref 7–25)
CHLORIDE: 100 mmol/L (ref 98–110)
CO2: 32 mmol/L (ref 20–32)
Calcium: 9.9 mg/dL (ref 8.6–10.4)
Creat: 1.18 mg/dL — ABNORMAL HIGH (ref 0.50–1.05)
GFR, EST AFRICAN AMERICAN: 62 mL/min/{1.73_m2} (ref >=60)
GFR, Est Non African American: 54 mL/min/{1.73_m2} — ABNORMAL LOW (ref >=60)
GLOBULIN: 3 g/dL (ref 1.9–3.7)
GLUCOSE: 90 mg/dL (ref 65–99)
Potassium: 3.6 mmol/L (ref 3.5–5.3)
SODIUM: 141 mmol/L (ref 135–146)
Total Protein: 7.4 g/dL (ref 6.1–8.1)

## 2017-10-01 LAB — LIPID PANEL
CHOL/HDL RATIO: 2.8 (calc) (ref <5.0)
Cholesterol: 113 mg/dL (ref <200)
HDL: 40 mg/dL — ABNORMAL LOW (ref >50)
LDL CHOLESTEROL (CALC): 57 mg/dL
NON-HDL CHOLESTEROL (CALC): 73 mg/dL (ref <130)
Triglycerides: 80 mg/dL (ref <150)

## 2017-10-01 LAB — MICROALBUMIN / CREATININE URINE RATIO: Creatinine, Urine: 54 mg/dL (ref 20–275)

## 2017-10-01 LAB — HEMOGLOBIN A1C
HEMOGLOBIN A1C: 6.4 %{Hb} — AB (ref <5.7)
Mean Plasma Glucose: 137 (calc)
eAG (mmol/L): 7.6 (calc)

## 2017-10-13 ENCOUNTER — Telehealth: Payer: Self-pay | Admitting: Family Medicine

## 2017-10-13 ENCOUNTER — Other Ambulatory Visit: Payer: Self-pay

## 2017-10-13 MED ORDER — BLOOD GLUCOSE MONITOR KIT: 1.0000 | PACK | Freq: Every day | 0 refills | Status: AC

## 2017-10-13 NOTE — Telephone Encounter (Signed)
Done

## 2017-10-13 NOTE — Telephone Encounter (Signed)
Please call the pt regarding Glucometer

## 2017-10-15 ENCOUNTER — Telehealth: Payer: Self-pay | Admitting: Family Medicine

## 2017-10-15 NOTE — Telephone Encounter (Signed)
Patient  Is requesting a different type of test strips for her glucose monitor  accucheck one touch vario  Uses [facility] in [place].  Cb#: [phone]

## 2017-10-20 LAB — COLOGUARD

## 2017-10-21 MED ORDER — GLUCOSE BLOOD VI STRP: ORAL_STRIP | 12 refills | Status: AC

## 2017-10-21 NOTE — Telephone Encounter (Signed)
Done

## 2017-10-23 ENCOUNTER — Encounter: Payer: 59 | Attending: Family Medicine | Admitting: Nutrition

## 2017-10-23 ENCOUNTER — Encounter: Payer: Self-pay | Admitting: Nutrition

## 2017-10-23 VITALS — Ht 61.0 in | Wt 199.0 lb

## 2017-10-23 DIAGNOSIS — E669 Obesity, unspecified: Secondary | ICD-10-CM

## 2017-10-23 DIAGNOSIS — IMO0002 Reserved for concepts with insufficient information to code with codable children: Secondary | ICD-10-CM

## 2017-10-23 DIAGNOSIS — E1165 Type 2 diabetes mellitus with hyperglycemia: Secondary | ICD-10-CM

## 2017-10-23 DIAGNOSIS — E782 Mixed hyperlipidemia: Secondary | ICD-10-CM

## 2017-10-23 DIAGNOSIS — E119 Type 2 diabetes mellitus without complications: Secondary | ICD-10-CM | POA: Insufficient documentation

## 2017-10-23 DIAGNOSIS — Z713 Dietary counseling and surveillance: Secondary | ICD-10-CM | POA: Insufficient documentation

## 2017-10-23 DIAGNOSIS — E118 Type 2 diabetes mellitus with unspecified complications: Secondary | ICD-10-CM

## 2017-10-23 NOTE — Patient Instructions (Addendum)
Goals 1. Continue to work on increase vegetables with lunch and dinner 2. Work on quitting smoking. 3. Walk 30 minutes 3 times per week 4. Keep walking and staking steps when you can. Lose 2-3 lbs per month Focus on higher fiber foods

## 2017-10-23 NOTE — Progress Notes (Signed)
Diabetes Self-Management Education  Visit Type:    Appt. Start Time: 0800 Appt. End Time: 0830  10/23/2017  Dana Ryan, identified by name and date of birth, is a 52 y.o. female with a diagnosis of Diabetes:  .  Cut down on Red Bull to one a week.. Has cut out junk food.  She is cutting back on excessive carbs. Testing daily and avg 115 mg/ld.  Has been working on eating at  meal times. Walking further to work and taking the steps. Lost 4 lbs. A1C down from 8.1% to 6.4%. Feels better. Has more energy.  Still working on quitting smoking.    Desires to continue to work on weight loss and increase more vegetables.  Lipid Panel     Component Value Date/Time   CHOL 113 09/30/2017 1510   TRIG 80 09/30/2017 1510   HDL 40 (L) 09/30/2017 1510   CHOLHDL 2.8 09/30/2017 1510   VLDL 17 06/10/2016 0619   LDLCALC 111 (H) 06/10/2016 0619   Lab Results  Component Value Date   HGBA1C 6.4 (H) 09/30/2017   CMP Latest Ref Rng & Units 09/30/2017 06/09/2016 06/08/2016  Glucose 65 - 99 mg/dL 90 098(J135(H) 191(Y113(H)  BUN 7 - 25 mg/dL 21 12 13   Creatinine 0.50 - 1.05 mg/dL 7.82(N1.18(H) 5.620.88 1.300.86  Sodium 135 - 146 mmol/L 141 137 140  Potassium 3.5 - 5.3 mmol/L 3.6 3.8 3.3(L)  Chloride 98 - 110 mmol/L 100 103 105  CO2 20 - 32 mmol/L 32 25 27  Calcium 8.6 - 10.4 mg/dL 9.9 9.6 9.5  Total Protein 6.1 - 8.1 g/dL 7.4 7.7 7.6  Total Bilirubin 0.2 - 1.2 mg/dL 0.6 0.7 0.5  Alkaline Phos 38 - 126 U/L - 99 106  AST 10 - 35 U/L 25 26 26   ALT 6 - 29 U/L 27 25 24    ASSESSMENT Wt Readings from Last 3 Encounters:  10/23/17 199 lb (90.3 kg)  09/24/17 203 lb (92.1 kg)  09/08/17 201 lb 4 oz (91.3 kg)   Ht Readings from Last 3 Encounters:  10/23/17 5\' 1"  (1.549 m)  09/24/17 5' 1.5" (1.562 m)  09/08/17 5' (1.524 m)   Body mass index is 37.6 kg/m.   Individualized Plan for Diabetes Self-Management Training:   Learning Objective:  Patient will have a greater understanding of diabetes self-management. Patient  education plan is to attend individual and/or group sessions per assessed needs and concerns.     1/2 bagel with pb, coffee Toss salad  Chicken, water, fruit, cranberrries, Leftovers:  Pasta 1 cup. Water  Plan:  Goals 1. Continue to work on increase vegetables with lunch and dinner 2. Work on quitting smoking. 3. Walk 30 minutes 3 times per week 4. Keep walking and staking steps when you can. Lose 2-3 lbs per month  Focus on higher fiber foods   Expected Outcomes:     Improved self management of her diabetes, reduced complications and weight loss .  Education material provided: Living Well with Diabetes, Food label handouts, A1C conversion sheet, Meal plan card, My Plate and Carbohydrate counting sheet  If problems or questions, patient to contact team via:  Phone and Email  Future DSME appointment:    [facility] to quit smoking.

## 2017-12-08 ENCOUNTER — Ambulatory Visit: Payer: 59 | Admitting: Family Medicine

## 2017-12-15 ENCOUNTER — Ambulatory Visit: Payer: 59 | Admitting: Family Medicine

## 2017-12-23 ENCOUNTER — Ambulatory Visit (INDEPENDENT_AMBULATORY_CARE_PROVIDER_SITE_OTHER): Payer: 59 | Admitting: Family Medicine

## 2017-12-23 ENCOUNTER — Other Ambulatory Visit: Payer: Self-pay

## 2017-12-23 ENCOUNTER — Encounter: Payer: Self-pay | Admitting: Family Medicine

## 2017-12-23 VITALS — BP 148/96 | HR 54 | Temp 98.6°F | Resp 16 | Ht 61.5 in | Wt 199.0 lb

## 2017-12-23 DIAGNOSIS — Z9109 Other allergy status, other than to drugs and biological substances: Secondary | ICD-10-CM

## 2017-12-23 DIAGNOSIS — Z720 Tobacco use: Secondary | ICD-10-CM | POA: Diagnosis not present

## 2017-12-23 DIAGNOSIS — E782 Mixed hyperlipidemia: Secondary | ICD-10-CM | POA: Diagnosis not present

## 2017-12-23 MED ORDER — PREDNISONE 20 MG PO TABS: 20.0000 mg | ORAL_TABLET | Freq: Two times a day (BID) | ORAL | 0 refills | Status: DC

## 2017-12-23 MED ORDER — BUPROPION HCL ER (SMOKING DET) 150 MG PO TB12: 150.0000 mg | ORAL_TABLET | Freq: Two times a day (BID) | ORAL | 11 refills | Status: DC

## 2017-12-23 NOTE — Progress Notes (Signed)
Chief Complaint  Patient presents with  . Essential hypertension    follow up  . tobacco use  . morbid obesity   Patient is here for routine follow-up. We discussed her blood pressure. She states her blood pressure is normally well controlled. It is elevated today because she has not yet taken her medication.  She is reminded the importance of taking her medication every morning. She takes Lipitor 80 a day for hyperlipidemia. I have emphasized to her the importance of cholesterol control, blood pressure control, and smoking cessation in order to prevent another CVA. Her most recent cholesterol numbers are  Results for LEVAEH, VICE (MRN [id number]) as of 12/23/2017 16:19  Ref. Range 09/30/2017 15:10  Total CHOL/HDL Ratio Latest Ref Range: <5.0 (calc) 2.8  Cholesterol Latest Ref Range: <200 mg/dL 113  HDL Cholesterol Latest Ref Range: >50 mg/dL 40 (L)  LDL Cholesterol (Calc) Latest Units: mg/dL (calc) 57  MICROALB/CREAT RATIO Latest Ref Range: <30 mcg/mg creat NOTE  Non-HDL Cholesterol (Calc) Latest Ref Range: <130 mg/dL (calc) 73  Triglycerides Latest Ref Range: <150 mg/dL 80   I have discussed the multiple health risks associated with cigarette smoking including, but not limited to, cardiovascular disease, lung disease and cancer.  I have strongly recommended that smoking be stopped.  I have reviewed the various methods of quitting including cold [place], classes, nicotine replacements and prescription medications.  I have offered assistance in this difficult process.  The patient is limited because of financial restrictions.  Her insurance company will not pay for Chantix or Zyban or patches.  She states she had the best look in the past using name brand Zyban.  She wishes for me to write a prescription for this and he will buy it through her healthcare savings account.  We discussed that smoking was increasing her risk of heart attack and a repeat stroke.  She understands will make  every effort to quit.  We set a quit date of May 1.  She said her wedding anniversary is in May, and she will make an effort to quit by that date. We discussed her weight.  The patient needs to adhere to a low carbohydrate, low-cholesterol, heart healthy diet.  She also needs to exercise daily.  She complains she does not of the time.  The importance of weight loss for her health and well-being is discussed with her.  Is uncontrolled environmental allergies.  She is using over-the-counter nasal sprays and antihistamines.  In spite of this she still has itchy watery eyes sneezing and congestion.  She wonders what else can be taken.  I told her I will give her 5 days of prednisone to see if this helps.  Patient Active Problem List   Diagnosis Date Noted  . HLD (hyperlipidemia) 09/08/2017  . Essential hypertension 07/29/2017  . Controlled diabetes mellitus type II without complication ([place]) 03/54/6568  . Morbid obesity ([place]) 07/29/2017  . Tobacco use 06/08/2016  . Acute CVA (cerebrovascular accident) ([place]) 06/08/2016    Outpatient Encounter Medications as of 12/23/2017  Medication Sig  . aspirin EC 81 MG EC tablet Take 1 tablet (81 mg total) by mouth daily.  Marland Kitchen atorvastatin (LIPITOR) 80 MG tablet Take 1 tablet (80 mg total) by mouth daily at 6 PM.  . bisoprolol-hydrochlorothiazide (ZIAC) 5-6.25 MG tablet Take 1 tablet by mouth daily.  . blood glucose meter kit and supplies KIT Inject 1 each into the skin daily. Dispense based on patient and insurance preference. Test once  daily. (FOR ICD-9 250.00, 250.01).  . CALCIUM PO Take 1 tablet by mouth daily.  Marland Kitchen glucose blood test strip accu check  one touch verio, test daily. Dx : e11.9  . hydrochlorothiazide (HYDRODIURIL) 12.5 MG tablet   . metFORMIN (GLUCOPHAGE-XR) 500 MG 24 hr tablet Take 500 mg by mouth daily at 8 pm.  . buPROPion (ZYBAN) 150 MG 12 hr tablet Take 1 tablet (150 mg total) by mouth 2 (two) times daily.  . predniSONE (DELTASONE) 20 MG  tablet Take 1 tablet (20 mg total) by mouth 2 (two) times daily with a meal.   No facility-administered encounter medications on file as of 12/23/2017.     Allergies  Allergen Reactions  . Codeine Hives    Raised red itchy bites    Review of Systems  Constitutional: Negative for activity change, appetite change and unexpected weight change.  HENT: Positive for congestion, postnasal drip, rhinorrhea, sinus pressure and sneezing. Negative for dental problem.   Eyes: Positive for redness. Negative for photophobia and visual disturbance.  Respiratory: Negative for cough and choking.   Cardiovascular: Negative for chest pain and leg swelling.  Gastrointestinal: Negative for constipation and diarrhea.  Endocrine: Negative for polydipsia and polyphagia.  Genitourinary: Negative for difficulty urinating and frequency.  Musculoskeletal: Negative for back pain and gait problem.  Neurological: Negative for dizziness and headaches.  Psychiatric/Behavioral: Negative for sleep disturbance. The patient is not nervous/anxious.     Physical Exam  Constitutional: She is oriented to person, place, and time. She appears well-developed and well-nourished.  Moderate obesity  HENT:  Head: Normocephalic and atraumatic.  Right Ear: External ear normal.  Left Ear: External ear normal.  Mouth/Throat: Oropharynx is clear and moist.  Nasal membranes pale and swollen.  Clear rhinorrhea.  Eyes: Pupils are equal, round, and reactive to light. Conjunctivae are normal.  Neck: Normal range of motion. Neck supple. No thyromegaly present.  Cardiovascular: Normal rate, regular rhythm and normal heart sounds.  Pulmonary/Chest: Effort normal and breath sounds normal. No respiratory distress.  Musculoskeletal: Normal range of motion. She exhibits no edema.  Lymphadenopathy:    She has no cervical adenopathy.  Neurological: She is alert and oriented to person, place, and time.  Gait normal  Psychiatric: She has a  normal mood and affect. Her behavior is normal. Thought content normal.  Nursing note and vitals reviewed.   BP (!) 148/96   Pulse (!) 54   Temp 98.6 F (37 C) (Oral)   Resp 16   Ht 5' 1.5" (1.562 m)   Wt 199 lb (90.3 kg)   SpO2 97%   BMI 36.99 kg/m      ASSESSMENT/PLAN:  1. Environmental allergies 5 days of prednisone as authorized 2.  Tobacco abuse See above discussion regarding smoking cessation.  The patient wishes a prescription for Zyban 3.  Morbid obesity See above discussion.  Diet and exercise emphasized to patient.  We have already made referral to nutrition.  4.  Hyperlipidemia Adequately controlled.  Continue Lipitor  Discussed health maintenance.  She has had a Pap in many years.  She is not interested in scheduling a Pap smear today.  She will discuss this with her new provider.  Patient Instructions  I am glad you are trying to quit smoking Take the prednisone for allergy symptoms Continue other medicines See Dr Mannie Stabile in 3 months Need labs before visit, call the week before to get the order placed Walk every day that you are able  Call us  with your currents medicines   Raylene Everts, MD

## 2017-12-23 NOTE — Patient Instructions (Signed)
I am glad you are trying to quit smoking Take the prednisone for allergy symptoms Continue other medicines See Dr Tracie HarrierHagler in 3 months Need labs before visit, call the week before to get the order placed Walk every day that you are able  Call us with your currents medicines

## 2017-12-25 ENCOUNTER — Telehealth: Payer: Self-pay | Admitting: Family Medicine

## 2017-12-25 NOTE — Telephone Encounter (Signed)
Pt needs Bisoprolol- hydrochlorothiazide 5-6.25, filled, she is good on all the others.

## 2017-12-30 ENCOUNTER — Other Ambulatory Visit (HOSPITAL_COMMUNITY): Payer: Self-pay | Admitting: Family Medicine

## 2017-12-30 MED ORDER — BISOPROLOL-HYDROCHLOROTHIAZIDE 5-6.25 MG PO TABS: 1.0000 | ORAL_TABLET | Freq: Every day | ORAL | 2 refills | Status: DC

## 2017-12-31 ENCOUNTER — Other Ambulatory Visit: Payer: Self-pay

## 2017-12-31 MED ORDER — BISOPROLOL-HYDROCHLOROTHIAZIDE 5-6.25 MG PO TABS: 1.0000 | ORAL_TABLET | Freq: Every day | ORAL | 0 refills | Status: DC

## 2018-01-14 ENCOUNTER — Ambulatory Visit: Payer: 59 | Admitting: Nutrition

## 2018-02-06 ENCOUNTER — Encounter: Payer: Self-pay | Admitting: Family Medicine

## 2018-03-24 ENCOUNTER — Ambulatory Visit: Payer: 59 | Admitting: Family Medicine

## 2018-04-08 DIAGNOSIS — Z1389 Encounter for screening for other disorder: Secondary | ICD-10-CM | POA: Diagnosis not present

## 2018-04-08 DIAGNOSIS — Z0000 Encounter for general adult medical examination without abnormal findings: Secondary | ICD-10-CM | POA: Diagnosis not present

## 2018-04-08 DIAGNOSIS — E119 Type 2 diabetes mellitus without complications: Secondary | ICD-10-CM | POA: Diagnosis not present

## 2018-04-08 DIAGNOSIS — I10 Essential (primary) hypertension: Secondary | ICD-10-CM | POA: Diagnosis not present

## 2018-04-08 DIAGNOSIS — Z719 Counseling, unspecified: Secondary | ICD-10-CM | POA: Diagnosis not present

## 2018-04-22 ENCOUNTER — Other Ambulatory Visit (HOSPITAL_COMMUNITY): Payer: Self-pay | Admitting: Internal Medicine

## 2018-04-22 DIAGNOSIS — R921 Mammographic calcification found on diagnostic imaging of breast: Secondary | ICD-10-CM

## 2018-04-28 ENCOUNTER — Ambulatory Visit (HOSPITAL_COMMUNITY)
Admission: RE | Admit: 2018-04-28 | Discharge: 2018-04-28 | Disposition: A | Discharge status: Home / Self Care | Payer: 59 | Source: Ambulatory Visit | Attending: Internal Medicine | Admitting: Internal Medicine

## 2018-04-28 DIAGNOSIS — R921 Mammographic calcification found on diagnostic imaging of breast: Secondary | ICD-10-CM | POA: Insufficient documentation

## 2018-09-08 DIAGNOSIS — E119 Type 2 diabetes mellitus without complications: Secondary | ICD-10-CM | POA: Diagnosis not present

## 2018-09-08 DIAGNOSIS — E782 Mixed hyperlipidemia: Secondary | ICD-10-CM | POA: Diagnosis not present

## 2018-09-08 DIAGNOSIS — I10 Essential (primary) hypertension: Secondary | ICD-10-CM | POA: Diagnosis not present

## 2019-01-18 DIAGNOSIS — N3946 Mixed incontinence: Secondary | ICD-10-CM | POA: Diagnosis not present

## 2019-01-18 DIAGNOSIS — I10 Essential (primary) hypertension: Secondary | ICD-10-CM | POA: Diagnosis not present

## 2020-03-04 ENCOUNTER — Emergency Department (HOSPITAL_COMMUNITY)
Admission: EM | Admit: 2020-03-04 | Discharge: 2020-03-04 | Disposition: A | Discharge status: Home / Self Care | Payer: 59 | Attending: Emergency Medicine | Admitting: Emergency Medicine

## 2020-03-04 ENCOUNTER — Other Ambulatory Visit: Payer: Self-pay

## 2020-03-04 ENCOUNTER — Encounter (HOSPITAL_COMMUNITY): Payer: Self-pay | Admitting: Emergency Medicine

## 2020-03-04 DIAGNOSIS — Z79899 Other long term (current) drug therapy: Secondary | ICD-10-CM | POA: Insufficient documentation

## 2020-03-04 DIAGNOSIS — I10 Essential (primary) hypertension: Secondary | ICD-10-CM | POA: Insufficient documentation

## 2020-03-04 DIAGNOSIS — F17210 Nicotine dependence, cigarettes, uncomplicated: Secondary | ICD-10-CM | POA: Insufficient documentation

## 2020-03-04 DIAGNOSIS — E119 Type 2 diabetes mellitus without complications: Secondary | ICD-10-CM | POA: Insufficient documentation

## 2020-03-04 DIAGNOSIS — Z7982 Long term (current) use of aspirin: Secondary | ICD-10-CM | POA: Insufficient documentation

## 2020-03-04 NOTE — Discharge Instructions (Addendum)
Keep a daily log of your blood pressure - first thing in the morning and also before bedtime at night.  Try to eat less salty food and drink water.  Call your doctor's office on Tuesday to follow up on this ER visit.  If your systolic blood pressure (top number) is above 150 mm hg on two back-to-back checks, you can take a 2nd dose of your bisoprolol tablet in the evening (in addition to the morning dose).  If you ever notice slurred speech, severe headache, numbness in your hands or feet, or sudden loss of balance/dizziness, return to the ER immediately.  These may be signs or symptoms of a stroke, and require rapid treatment in the ER.

## 2020-03-04 NOTE — ED Triage Notes (Signed)
Pt c/o hypertension, stating highest was 190/117. States it has been fluctuating. Pt c/o headache that started recently.

## 2020-03-04 NOTE — ED Provider Notes (Signed)
Dana Ryan [facility] EMERGENCY DEPARTMENT Provider Note   CSN: [id number] Arrival date & time: 03/04/20  1949     History Chief Complaint  Patient presents with  . Hypertension    Dana Ryan is a 54 y.o. female history of hypertension, diabetes, presenting to the emergency department with headache.  Patient reports that earlier today she was having a headache.  She took her blood pressure repeatedly at home with her monitor and it was consistently high.  She reports a generalized headache when she arrived in the ER.  At triage her blood pressure was 225/113.  Since sitting in her bed, her headache is dramatically improved, as has her blood pressure.  She does not routinely check her blood pressure at home, although she does have a blood pressure cuff  She denies any chest pain, palpitations, pressure, shortness of breath with any of the symptoms.  She denies any history of kidney disease or changes in her urinary habits.  She does report that she has been only intermittently compliant with some of her blood pressure medications.  She has been taking both of her blood pressure medicines for "the past few days" but states she missed multiple doses before that.  She also reports that she had an excessively salty meal with fish yesterday evening.  She does not drink a lot of water and tends to drink soda and other such drinks.  She feels very thirsty.  On Wednesday, 4 days ago she had an episode where she was noting some paresthesias in her right hand and foot.  These have completely resolved.  She does report a distant history of TIA or mini stroke with left-sided paresthesia in the past.  HPI     Past Medical History:  Diagnosis Date  . Allergy   . Diabetes mellitus without complication ([place])   . Heart murmur   . Hyperlipidemia   . Hypertension   . Hypertensive emergency 06/08/2016  . Left-sided weakness 06/08/2016  . Stroke [facility])     Patient Active Problem List   Diagnosis  Date Noted  . HLD (hyperlipidemia) 09/08/2017  . Essential hypertension 07/29/2017  . Controlled diabetes mellitus type II without complication ([place]) 17/79/3903  . Morbid obesity ([place]) 07/29/2017  . Tobacco use 06/08/2016  . Acute CVA (cerebrovascular accident) ([place]) 06/08/2016    History reviewed. No pertinent surgical history.   OB History    Gravida  0   Para  0   Term  0   Preterm  0   AB  0   Living  0     SAB  0   TAB  0   Ectopic  0   Multiple  0   Live Births  0           Family History  Problem Relation Age of Onset  . Cancer Mother        breast  . Hyperlipidemia Mother   . Arthritis Mother   . Heart disease Mother   . Hypertension Mother   . Miscarriages / [place] Mother   . Cancer Father        throat  . Hypertension Father   . Heart disease Father        aneurysm on heart  . Diabetes Sister   . Heart disease Maternal Grandmother   . Hyperlipidemia Maternal Grandmother   . Hypertension Maternal Grandmother   . Stroke Maternal Grandfather     Social History   Tobacco Use  . Smoking  status: Current Some Day Smoker    Packs/day: 1.00    Years: 20.00    Pack years: 20.00    Types: Cigarettes    Start date: 09/02/1989  . Smokeless tobacco: Never Used  . Tobacco comment: 1 ppd  Vaping Use  . Vaping Use: Never used  Substance Use Topics  . Alcohol use: Yes    Alcohol/week: 0.0 standard drinks    Comment: rarely  . Drug use: No    Home Medications Prior to Admission medications   Medication Sig Start Date End Date Taking? Authorizing Provider  aspirin EC 81 MG EC tablet Take 1 tablet (81 mg total) by mouth daily. 06/11/16   Isaac Bliss, Rayford Halsted, MD  atorvastatin (LIPITOR) 80 MG tablet Take 1 tablet (80 mg total) by mouth daily at 6 PM. 06/10/16   Isaac Bliss, Rayford Halsted, MD  bisoprolol-hydrochlorothiazide [facility]) 5-6.25 MG tablet Take 1 tablet by mouth daily. 12/31/17   Raylene Everts, MD  blood glucose meter kit  and supplies KIT Inject 1 each into the skin daily. Dispense based on patient and insurance preference. Test once daily. (FOR ICD-9 250.00, 250.01). 10/13/17   Raylene Everts, MD  buPROPion (ZYBAN) 150 MG 12 hr tablet Take 1 tablet (150 mg total) by mouth 2 (two) times daily. 12/23/17   Raylene Everts, MD  CALCIUM PO Take 1 tablet by mouth daily.    [provider]  glucose blood test strip accu check  one touch verio, test daily. Dx : e11.9 10/21/17   Raylene Everts, MD  hydrochlorothiazide (HYDRODIURIL) 12.5 MG tablet  07/01/17   [provider]  metFORMIN (GLUCOPHAGE-XR) 500 MG 24 hr tablet Take 500 mg by mouth daily at 8 pm. 06/23/17   [provider]  predniSONE (DELTASONE) 20 MG tablet Take 1 tablet (20 mg total) by mouth 2 (two) times daily with a meal. 12/23/17   Raylene Everts, MD    Allergies    Codeine  Review of Systems   Review of Systems  Constitutional: Negative for chills and fever.  HENT: Negative for ear pain and sore throat.   Eyes: Negative for pain and visual disturbance.  Respiratory: Negative for cough and shortness of breath.   Cardiovascular: Negative for chest pain and palpitations.  Gastrointestinal: Negative for abdominal pain and vomiting.  Genitourinary: Negative for dysuria and hematuria.  Musculoskeletal: Negative for arthralgias and back pain.  Skin: Negative for color change and rash.  Neurological: Positive for headaches. Negative for syncope and light-headedness.  Psychiatric/Behavioral: Negative for agitation and confusion.  All other systems reviewed and are negative.   Physical Exam Updated Vital Signs BP (!) 192/96   Pulse (!) 48   Temp (!) 97.5 F (36.4 C)   Resp (!) 22   Ht _0  (1.549 m)   Wt 81.6 kg   SpO2 97%   BMI 34.01 kg/m   Physical Exam Vitals and nursing note reviewed.  Constitutional:      General: She is not in acute distress.    Appearance: She is well-developed.  HENT:      Head: Normocephalic and atraumatic.  Eyes:     Conjunctiva/sclera: Conjunctivae normal.  Cardiovascular:     Rate and Rhythm: Normal rate and regular rhythm.     Pulses: Normal pulses.  Pulmonary:     Effort: Pulmonary effort is normal. No respiratory distress.     Breath sounds: Normal breath sounds.  Abdominal:     General:  There is no distension.     Palpations: Abdomen is soft.     Tenderness: There is no abdominal tenderness.  Musculoskeletal:     Cervical back: Neck supple.  Skin:    General: Skin is warm and dry.  Neurological:     General: No focal deficit present.     Mental Status: She is alert. Mental status is at baseline. She is disoriented.     Sensory: No sensory deficit.     Motor: No weakness.  Psychiatric:        Mood and Affect: Mood normal.        Behavior: Behavior normal.     ED Results / Procedures / Treatments   Labs (all labs ordered are listed, but only abnormal results are displayed) Labs Reviewed - No data to display  EKG EKG Interpretation  Date/Time:  Saturday March 04 2020 20:13:19 EDT Ventricular Rate:  54 PR Interval:    QRS Duration: 97 QT Interval:  456 QTC Calculation: 433 R Axis:   -98 Text Interpretation: Sinus rhythm No sig change from 2017 ecg, anterior q waves noted on 2017 ecg No STEMI Confirmed by Octaviano Glow [phone]) on 03/04/2020 8:51:18 PM   Radiology No results found.  Procedures Procedures (including critical care time)  Medications Ordered in ED Medications - No data to display  ED Course  I have reviewed the triage vital signs and the nursing notes.  Pertinent labs & imaging results that were available during my care of the patient were reviewed by me and considered in my medical decision making (see chart for details).  This is a 54 year old female presenting to the emergency department with complaint of hypertension and headache.  I think her high blood pressure was most likely triggered by several days  of medical noncompliance, combined with a very salty meal consumed yesterday.  She was noted to be having a headache today and had very high blood pressure all day.  After triage while sitting in the room, her blood pressure gradually drifted down, and her headache dissipated.  She had a benign clinical exam.  I have a very low suspicion for stroke upon my examination.  She no active chest pain.  Do not feel like she needed hypertensive emergency work-up at this time.  I feel like is more reasonable to discuss the treatment plan at home including doubling her Ziac as needed until she can f/u with her PCP this week in the office.  She will monitor her BP at home.  I recommended reduced salt intake and drinking water, not soda.  She also reported some paresthesias several days ago which have now resolved.  She is on appropriate TIA medications with 81 mg aspirin.  I feel it is reasonable to discharge her home, but advised the patient's husband that if he notices any concerning neurological deficits, they should return immediately to the emergency department.  They verbalized understanding     Final Clinical Impression(s) / ED Diagnoses Final diagnoses:  Hypertension, unspecified type    Rx / DC Orders ED Discharge Orders    None       Wyvonnia Dusky, MD 03/05/20 1118

## 2020-05-02 ENCOUNTER — Encounter: Payer: Self-pay | Admitting: Neurology

## 2020-05-02 ENCOUNTER — Other Ambulatory Visit: Payer: Self-pay

## 2020-05-02 ENCOUNTER — Ambulatory Visit (INDEPENDENT_AMBULATORY_CARE_PROVIDER_SITE_OTHER): Payer: 59 | Admitting: Neurology

## 2020-05-02 VITALS — BP 129/88 | HR 73 | Ht 61.0 in | Wt 198.0 lb

## 2020-05-02 DIAGNOSIS — I63331 Cerebral infarction due to thrombosis of right posterior cerebral artery: Secondary | ICD-10-CM | POA: Diagnosis not present

## 2020-05-02 MED ORDER — CLOPIDOGREL BISULFATE 75 MG PO TABS: 75.0000 mg | ORAL_TABLET | Freq: Every day | ORAL | 11 refills | Status: DC

## 2020-05-02 NOTE — Progress Notes (Signed)
Chief Complaint  Patient presents with  . New Patient (Initial Visit)    Reports constant right sided numbness that waxes and wanes in severity. Symptoms started on March 01, 2020.  Marland Kitchen PCP     Redmond School, MD ([place])    HISTORICAL  Shaili Donalson is a 54 year old female, seen in request by her primary care Dr. Redmond School for evaluation of sudden onset right-sided numbness, initial evaluation was on May 02, 2020  I reviewed and summarized the referring note. PMHx DM Stroke, HTN Smoke, PPD HLD.  She had a history of 2017, presented with sudden onset left hand clumsiness, lasting for couple weeks, I personally reviewed MRI of the brain in October 2017, acute nonhemorrhagic right basal ganglion infarction, CT angiogram head and neck showed no large vessel disease  On March 04, 2020 she woke up noticed right-sided numbness involving whole right side, especially right hand and foot, also noted blood pressure, presented to emergency room, blood pressure documented was 225/113, also complained of headaches, blood pressure improved following emergency room treatment, but she had a persistent numbness involving right side, now complains of right hand and the foot felt Ice Cube sometimes, right scalp paresthesia  Per patient, she has been compliant with her aspirin 81 mg daily, smoke about a pack a day, recent lab laboratory evaluation showed elevated A1c, but I do not have the number   REVIEW OF SYSTEMS: Full 14 system review of systems performed and notable only for  As above. All other review of systems were negative.  ALLERGIES: Allergies  Allergen Reactions  . Codeine Hives    Raised red itchy bites    HOME MEDICATIONS: Current Outpatient Medications  Medication Sig Dispense Refill  . amLODipine (NORVASC) 5 MG tablet Take 5 mg by mouth daily.    Marland Kitchen aspirin EC 81 MG EC tablet Take 1 tablet (81 mg total) by mouth daily.    Marland Kitchen atorvastatin (LIPITOR) 80 MG tablet  Take 1 tablet (80 mg total) by mouth daily at 6 PM. 30 tablet 2  . blood glucose meter kit and supplies KIT Inject 1 each into the skin daily. Dispense based on patient and insurance preference. Test once daily. (FOR ICD-9 250.00, 250.01). 1 each 0  . CALCIUM PO Take 1 tablet by mouth daily.    . cetirizine (ZYRTEC) 10 MG tablet Take 10 mg by mouth daily.    Marland Kitchen glucose blood test strip accu check  one touch verio, test daily. Dx : e11.9 100 each 12  . lisinopril (ZESTRIL) 10 MG tablet Take 10 mg by mouth daily.    . metFORMIN (GLUCOPHAGE-XR) 500 MG 24 hr tablet Take 500 mg by mouth daily at 8 pm.    . oxybutynin (DITROPAN-XL) 10 MG 24 hr tablet Take 10 mg by mouth daily.     No current facility-administered medications for this visit.    PAST MEDICAL HISTORY: Past Medical History:  Diagnosis Date  . Allergy   . Diabetes mellitus without complication ([place])   . Heart murmur   . Hyperlipidemia   . Hypertension   . Hypertensive emergency 06/08/2016  . Left-sided weakness 06/08/2016  . Paresthesia   . Stroke [facility])     PAST SURGICAL HISTORY: Past Surgical History:  Procedure Laterality Date  . No past sugery      FAMILY HISTORY: Family History  Problem Relation Age of Onset  . Cancer Mother        breast  . Hyperlipidemia Mother   .  Arthritis Mother   . Heart disease Mother   . Hypertension Mother   . Miscarriages / [place] Mother   . Cancer Father        throat  . Hypertension Father   . Heart disease Father        aneurysm on heart  . Diabetes Sister   . Heart disease Maternal Grandmother   . Hyperlipidemia Maternal Grandmother   . Hypertension Maternal Grandmother   . Stroke Maternal Grandfather     SOCIAL HISTORY: Social History   Socioeconomic History  . Marital status: Married    Spouse name: Legrand Como  . Number of children: 0  . Years of education: 47  . Highest education level: Associate degree: academic program  Occupational History  . Occupation:  admin asst  Tobacco Use  . Smoking status: Current Every Day Smoker    Packs/day: 1.00    Years: 20.00    Pack years: 20.00    Types: Cigarettes  . Smokeless tobacco: Never Used  . Tobacco comment: 1 ppd  Vaping Use  . Vaping Use: Never used  Substance and Sexual Activity  . Alcohol use: Never    Alcohol/week: 0.0 standard drinks  . Drug use: No  . Sexual activity: Yes    Birth control/protection: Post-menopausal  Other Topics Concern  . Not on file  Social History Narrative   Associate degree in visual communication.   Right-handed.   Lives at home with her husband.   Caffeine use: 1 cup per day.   Social Determinants of Health   Financial Resource Strain:   . Difficulty of Paying Living Expenses: Not on file  Food Insecurity:   . Worried About Charity fundraiser in the Last Year: Not on file  . Ran Out of Food in the Last Year: Not on file  Transportation Needs:   . Lack of Transportation (Medical): Not on file  . Lack of Transportation (Non-Medical): Not on file  Physical Activity:   . Days of Exercise per Week: Not on file  . Minutes of Exercise per Session: Not on file  Stress:   . Feeling of Stress : Not on file  Social Connections:   . Frequency of Communication with Friends and Family: Not on file  . Frequency of Social Gatherings with Friends and Family: Not on file  . Attends Religious Services: Not on file  . Active Member of Clubs or Organizations: Not on file  . Attends Archivist Meetings: Not on file  . Marital Status: Not on file  Intimate Partner Violence:   . Fear of Current or Ex-Partner: Not on file  . Emotionally Abused: Not on file  . Physically Abused: Not on file  . Sexually Abused: Not on file     PHYSICAL EXAM   Vitals:   05/02/20 0729  BP: 129/88  Pulse: 73  Weight: 198 lb (89.8 kg)  Height: _0  (1.549 m)   Not recorded     Body mass index is 37.41 kg/m.  PHYSICAL EXAMNIATION:  Gen: NAD, conversant, well  nourised, well groomed                     Cardiovascular: Regular rate rhythm, no peripheral edema, warm, nontender. Eyes: Conjunctivae clear without exudates or hemorrhage Neck: Supple, no carotid bruits. Pulmonary: Clear to auscultation bilaterally   NEUROLOGICAL EXAM:  MENTAL STATUS: Speech:    Speech is normal; fluent and spontaneous with normal comprehension.  Cognition:  Orientation to time, place and person     Normal recent and remote memory     Normal Attention span and concentration     Normal Language, naming, repeating,spontaneous speech     Fund of knowledge   CRANIAL NERVES: CN II: Visual fields are full to confrontation. Pupils are round equal and briskly reactive to light. CN III, IV, VI: extraocular movement are normal. No ptosis. CN V: Facial sensation is intact to light touch CN VII: Face is symmetric with normal eye closure  CN VIII: Hearing is normal to causal conversation. CN IX, X: Phonation is normal. CN XI: Head turning and shoulder shrug are intact  MOTOR: There is no pronator drift of out-stretched arms. Muscle bulk and tone are normal. Muscle strength is normal.  REFLEXES: Reflexes are 2+ and symmetric at the biceps, triceps, knees, and ankles. Plantar responses are flexor.  SENSORY: Intact to light touch, pinprick and vibratory sensation are intact in fingers and toes.  COORDINATION: There is no trunk or limb dysmetria noted.  GAIT/STANCE: Posture is normal. Gait is steady with normal steps, base, arm swing, and turning. Heel and toe walking are normal. Tandem gait is normal.  Romberg is absent.   DIAGNOSTIC DATA (LABS, IMAGING, TESTING) - I reviewed patient records, labs, notes, testing and imaging myself where available.   ASSESSMENT AND PLAN  Karna Abed is a 54 y.o. 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 Ph.D.  [facility] [address], [place], [place] [address] Ph: [phone] Fax: [phone]  CC:  Redmond School, MD [place],   [id number]  Redmond School, MD

## 2020-05-03 ENCOUNTER — Telehealth: Payer: Self-pay | Admitting: Neurology

## 2020-05-03 NOTE — Telephone Encounter (Signed)
[employer] Auth: [id number], [id number], & [id number] (exp. 05/02/20 to 06/16/20)  Patient is scheduled at [facility] for 05/17/20 to arrive at 12:30 pm. I left a voicemail informing this with the patient. I also left their number of [phone] incase she needs to r/s.

## 2020-05-17 ENCOUNTER — Ambulatory Visit (HOSPITAL_COMMUNITY)
Admission: RE | Admit: 2020-05-17 | Discharge: 2020-05-17 | Disposition: A | Discharge status: Home / Self Care | Payer: 59 | Source: Ambulatory Visit | Attending: Neurology | Admitting: Neurology

## 2020-05-17 ENCOUNTER — Other Ambulatory Visit: Payer: Self-pay

## 2020-05-17 ENCOUNTER — Ambulatory Visit (HOSPITAL_BASED_OUTPATIENT_CLINIC_OR_DEPARTMENT_OTHER)
Admission: RE | Admit: 2020-05-17 | Discharge: 2020-05-17 | Disposition: A | Discharge status: Home / Self Care | Payer: 59 | Source: Ambulatory Visit | Attending: Neurology | Admitting: Neurology

## 2020-05-17 DIAGNOSIS — I63331 Cerebral infarction due to thrombosis of right posterior cerebral artery: Secondary | ICD-10-CM

## 2020-05-17 LAB — ECHOCARDIOGRAM COMPLETE
AR max vel: 1.26 cm2
AV Area VTI: 1.24 cm2
AV Area mean vel: 1.25 cm2
AV Mean grad: 8 mmHg
AV Peak grad: 17.1 mmHg
Ao pk vel: 2.07 m/s
Area-P 1/2: 2.52 cm2
S' Lateral: 2.67 cm

## 2020-05-17 MED ORDER — GADOBUTROL 1 MMOL/ML IV SOLN
10.0000 mL | Freq: Once | INTRAVENOUS | Status: AC | PRN
  Administered 2020-05-17: 10 mL via INTRAVENOUS

## 2020-05-17 NOTE — Progress Notes (Signed)
*  PRELIMINARY RESULTS* Echocardiogram 2D Echocardiogram has been performed.  Stacey Drain 05/17/2020, 12:31 PM

## 2020-05-18 ENCOUNTER — Telehealth: Payer: Self-pay | Admitting: Neurology

## 2020-05-18 ENCOUNTER — Telehealth: Payer: Self-pay | Admitting: *Deleted

## 2020-05-18 NOTE — Telephone Encounter (Signed)
LVM informing patient there was no significant abnormality on echocardiogram. Left # for questions.

## 2020-05-18 NOTE — Telephone Encounter (Signed)
LVM informing patient Dr Terrace Arabia stated her MRI of the brain showed no acute stroke, evidence of old stroke involving left pons, right deep white matter structure. Evidence of intracranial atherosclerotic disease.  No significant large vessel disease. Left # for questions.

## 2020-05-18 NOTE — Telephone Encounter (Signed)
Pt called stating that she did not understand the VM and is wanting the RN to call her back at her office number. [phone]

## 2020-05-18 NOTE — Telephone Encounter (Signed)
1. No acute intracranial abnormality. 2. 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 3. Mild stenosis of the left PCA P1 segment. 4. Poor visualization of the left vertebral artery V1 segment with overall decreased flow related enhancement relative to the right. This may be due to decreased flow velocity or multifocal narrowing. Correlation with vascular ultrasound or CTA neck is recommended. 5. Mild narrowing of the proximal left internal carotid artery without significant stenosis by NASCET criteria.  Please call patient, MRI of the brain showed no acute stroke, evidence of old stroke involving left pons, right deep white matter structure  Evidence of intracranial atherosclerotic disease.  No significant large vessel disease

## 2020-05-18 NOTE — Telephone Encounter (Signed)
Called patient and reviewed MRI results. She had questions which were answered to her stated satisfaction. Reviewed stroke risk factors, management Patient verbalized understanding, appreciation.

## 2020-07-18 ENCOUNTER — Ambulatory Visit: Payer: 59 | Admitting: Neurology

## 2020-07-18 NOTE — Progress Notes (Deleted)
PATIENT: Dana Ryan DOB: October 01, 1965  REASON FOR VISIT: follow up HISTORY FROM: patient  HISTORY OF PRESENT ILLNESS: Today 07/18/20  HISTORY Dana Ryan is a 54 year old female, seen in request by her primary care Dr. Redmond School for evaluation of sudden onset right-sided numbness, initial evaluation was on May 02, 2020  I reviewed and summarized the referring note. PMHx DM Stroke, HTN Smoke, PPD HLD.  She had a history of 2017, presented with sudden onset left hand clumsiness, lasting for couple weeks, I personally reviewed MRI of the brain in October 2017, acute nonhemorrhagic right basal ganglion infarction, CT angiogram head and neck showed no large vessel disease  On March 04, 2020 she woke up noticed right-sided numbness involving whole right side, especially right hand and foot, also noted blood pressure, presented to emergency room, blood pressure documented was 225/113, also complained of headaches, blood pressure improved following emergency room treatment, but she had a persistent numbness involving right side, now complains of right hand and the foot felt Ice Cube sometimes, right scalp paresthesia  Per patient, she has been compliant with her aspirin 81 mg daily, smoke about a pack a day, recent lab laboratory evaluation showed elevated A1c, but I do not have the number  Update July 18, 2020 SS:    REVIEW OF SYSTEMS: Out of a complete 14 system review of symptoms, the patient complains only of the following symptoms, and all other reviewed systems are negative.  ALLERGIES: Allergies  Allergen Reactions  . Codeine Hives    Raised red itchy bites    HOME MEDICATIONS: Outpatient Medications Prior to Visit  Medication Sig Dispense Refill  . amLODipine (NORVASC) 5 MG tablet Take 5 mg by mouth daily.    Marland Kitchen aspirin EC 81 MG EC tablet Take 1 tablet (81 mg total) by mouth daily.    Marland Kitchen atorvastatin (LIPITOR) 80 MG tablet Take 1 tablet (80 mg  total) by mouth daily at 6 PM. 30 tablet 2  . blood glucose meter kit and supplies KIT Inject 1 each into the skin daily. Dispense based on patient and insurance preference. Test once daily. (FOR ICD-9 250.00, 250.01). 1 each 0  . CALCIUM PO Take 1 tablet by mouth daily.    . cetirizine (ZYRTEC) 10 MG tablet Take 10 mg by mouth daily.    . clopidogrel (PLAVIX) 75 MG tablet Take 1 tablet (75 mg total) by mouth daily. 30 tablet 11  . glucose blood test strip accu check  one touch verio, test daily. Dx : e11.9 100 each 12  . lisinopril (ZESTRIL) 10 MG tablet Take 10 mg by mouth daily.    . metFORMIN (GLUCOPHAGE-XR) 500 MG 24 hr tablet Take 500 mg by mouth daily at 8 pm.    . oxybutynin (DITROPAN-XL) 10 MG 24 hr tablet Take 10 mg by mouth daily.     No facility-administered medications prior to visit.    PAST MEDICAL HISTORY: Past Medical History:  Diagnosis Date  . Allergy   . Diabetes mellitus without complication ([place])   . Heart murmur   . Hyperlipidemia   . Hypertension   . Hypertensive emergency 06/08/2016  . Left-sided weakness 06/08/2016  . Paresthesia   . Stroke [facility])     PAST SURGICAL HISTORY: Past Surgical History:  Procedure Laterality Date  . No past sugery      FAMILY HISTORY: Family History  Problem Relation Age of Onset  . Cancer Mother        breast  .  Hyperlipidemia Mother   . Arthritis Mother   . Heart disease Mother   . Hypertension Mother   . Miscarriages / [place] Mother   . Cancer Father        throat  . Hypertension Father   . Heart disease Father        aneurysm on heart  . Diabetes Sister   . Heart disease Maternal Grandmother   . Hyperlipidemia Maternal Grandmother   . Hypertension Maternal Grandmother   . Stroke Maternal Grandfather     SOCIAL HISTORY: Social History   Socioeconomic History  . Marital status: Married    Spouse name: Legrand Como  . Number of children: 0  . Years of education: 51  . Highest education level: Associate  degree: academic program  Occupational History  . Occupation: admin asst  Tobacco Use  . Smoking status: Current Every Day Smoker    Packs/day: 1.00    Years: 20.00    Pack years: 20.00    Types: Cigarettes  . Smokeless tobacco: Never Used  . Tobacco comment: 1 ppd  Vaping Use  . Vaping Use: Never used  Substance and Sexual Activity  . Alcohol use: Never    Alcohol/week: 0.0 standard drinks  . Drug use: No  . Sexual activity: Yes    Birth control/protection: Post-menopausal  Other Topics Concern  . Not on file  Social History Narrative   Associate degree in visual communication.   Right-handed.   Lives at home with her husband.   Caffeine use: 1 cup per day.   Social Determinants of Health   Financial Resource Strain:   . Difficulty of Paying Living Expenses: Not on file  Food Insecurity:   . Worried About Charity fundraiser in the Last Year: Not on file  . Ran Out of Food in the Last Year: Not on file  Transportation Needs:   . Lack of Transportation (Medical): Not on file  . Lack of Transportation (Non-Medical): Not on file  Physical Activity:   . Days of Exercise per Week: Not on file  . Minutes of Exercise per Session: Not on file  Stress:   . Feeling of Stress : Not on file  Social Connections:   . Frequency of Communication with Friends and Family: Not on file  . Frequency of Social Gatherings with Friends and Family: Not on file  . Attends Religious Services: Not on file  . Active Member of Clubs or Organizations: Not on file  . Attends Archivist Meetings: Not on file  . Marital Status: Not on file  Intimate Partner Violence:   . Fear of Current or Ex-Partner: Not on file  . Emotionally Abused: Not on file  . Physically Abused: Not on file  . Sexually Abused: Not on file      PHYSICAL EXAM  There were no vitals filed for this visit. There is no height or weight on file to calculate BMI.  Generalized: Well developed, in no acute  distress   Neurological examination  Mentation: Alert oriented to time, place, history taking. Follows all commands speech and language fluent Cranial nerve II-XII: Pupils were equal round reactive to light. Extraocular movements were full, visual field were full on confrontational test. Facial sensation and strength were normal. Uvula tongue midline. Head turning and shoulder shrug  were normal and symmetric. Motor: The motor testing reveals 5 over 5 strength of all 4 extremities. Good symmetric motor tone is noted throughout.  Sensory: Sensory testing is intact  to soft touch on all 4 extremities. No evidence of extinction is noted.  Coordination: Cerebellar testing reveals good finger-nose-finger and heel-to-shin bilaterally.  Gait and station: Gait is normal. Tandem gait is normal. Romberg is negative. No drift is seen.  Reflexes: Deep tendon reflexes are symmetric and normal bilaterally.   DIAGNOSTIC DATA (LABS, IMAGING, TESTING) - I reviewed patient records, labs, notes, testing and imaging myself where available.  Lab Results  Component Value Date   WBC 11.5 (H) 09/30/2017   HGB 16.5 (H) 09/30/2017   HCT 46.6 (H) 09/30/2017   MCV 87.4 09/30/2017   PLT 239 09/30/2017      Component Value Date/Time   NA 141 09/30/2017 1510   K 3.6 09/30/2017 1510   CL 100 09/30/2017 1510   CO2 32 09/30/2017 1510   GLUCOSE 90 09/30/2017 1510   BUN 21 09/30/2017 1510   CREATININE 1.18 (H) 09/30/2017 1510   CALCIUM 9.9 09/30/2017 1510   PROT 7.4 09/30/2017 1510   ALBUMIN 4.3 06/09/2016 0724   AST 25 09/30/2017 1510   ALT 27 09/30/2017 1510   ALKPHOS 99 06/09/2016 0724   BILITOT 0.6 09/30/2017 1510   GFRNONAA 54 (L) 09/30/2017 1510   GFRAA 62 09/30/2017 1510   Lab Results  Component Value Date   CHOL 113 09/30/2017   HDL 40 (L) 09/30/2017   LDLCALC 57 09/30/2017   TRIG 80 09/30/2017   CHOLHDL 2.8 09/30/2017   Lab Results  Component Value Date   HGBA1C 6.4 (H) 09/30/2017   No  results found for: VITAMINB12 No results found for: TSH    ASSESSMENT AND PLAN 53 y.o. year old female  has a past medical history of Allergy, Diabetes mellitus without complication ([place]), Heart murmur, Hyperlipidemia, Hypertension, Hypertensive emergency (06/08/2016), Left-sided weakness (06/08/2016), Paresthesia, and Stroke ([place]). here with:  1.  Acute onset right-sided paresthesia   I spent 15 minutes with the patient. 50% of this time was spent   Butler Denmark, Eunice, DNP 07/18/2020, 5:49 AM [facility] [address], [place], [place] [address] [phone]

## 2020-07-20 ENCOUNTER — Ambulatory Visit: Payer: 59 | Admitting: Neurology

## 2020-07-24 ENCOUNTER — Ambulatory Visit (INDEPENDENT_AMBULATORY_CARE_PROVIDER_SITE_OTHER): Payer: 59 | Admitting: Neurology

## 2020-07-24 ENCOUNTER — Other Ambulatory Visit: Payer: Self-pay

## 2020-07-24 ENCOUNTER — Encounter: Payer: Self-pay | Admitting: Neurology

## 2020-07-24 VITALS — BP 145/82 | HR 71 | Ht 61.0 in | Wt 196.5 lb

## 2020-07-24 DIAGNOSIS — I63331 Cerebral infarction due to thrombosis of right posterior cerebral artery: Secondary | ICD-10-CM

## 2020-07-24 DIAGNOSIS — R202 Paresthesia of skin: Secondary | ICD-10-CM | POA: Diagnosis not present

## 2020-07-24 MED ORDER — CLOPIDOGREL BISULFATE 75 MG PO TABS: 75.0000 mg | ORAL_TABLET | Freq: Every day | ORAL | 4 refills | Status: AC

## 2020-07-24 NOTE — Progress Notes (Signed)
Chief Complaint  Patient presents with  . Cerebrovascular Accident    Hx of CVA/paresthesia. She has continued taking Plavix 29m, one tablet daily. She would like to review her MRI, MRA and echocardiogram results.     HISTORICAL  Dana Buschmanis a 54year old female, seen in request by her primary care Dr. LRedmond Schoolfor evaluation of sudden onset right-sided numbness, initial evaluation was on May 02, 2020  I reviewed and summarized the referring note. PMHx DM Stroke, HTN Smoke, PPD HLD.  She had a history of stroke in 2017, presented with sudden onset left hand clumsiness, lasting for couple weeks, I personally reviewed MRI of the brain in October 2017, acute nonhemorrhagic right basal ganglion infarction, CT angiogram head and neck showed no large vessel disease  On March 04, 2020 she woke up noticed right-sided numbness involving whole right side, especially right hand and foot, also noted blood pressure, presented to emergency room, blood pressure documented was 225/113, also complained of headaches, blood pressure improved following emergency room treatment, but she had a persistent numbness involving right side, now complains of right hand and the foot felt Ice Cube sometimes, right scalp paresthesia  Per patient, she has been compliant with her aspirin 81 mg daily, smoke about a pack a day, recent lab laboratory evaluation showed elevated A1c, but I do not have the number  UPDATE Jul 24 2020: She continues to complain of mild right hand paresthesia, mild weak grip, still improving, gait has back to normal  We personally reviewed MRI of the brain without contrast in October 2021, left pontine chronic stroke, right centrum semiovale mild small vessel disease, no acute abnormality  MRA of the brain and neck showed absent right A1, normal variant, mild intracranial atherosclerotic disease, decreased flow at the left V1, otherwise no significant large vessel  disease,  Echocardiogram was normal.  REVIEW OF SYSTEMS: Full 14 system review of systems performed and notable only for  As above. All other review of systems were negative.  ALLERGIES: Allergies  Allergen Reactions  . Codeine Hives    Raised red itchy bites    HOME MEDICATIONS: Current Outpatient Medications  Medication Sig Dispense Refill  . amLODipine (NORVASC) 5 MG tablet Take 5 mg by mouth daily.    .Marland Kitchenatorvastatin (LIPITOR) 80 MG tablet Take 1 tablet (80 mg total) by mouth daily at 6 PM. 30 tablet 2  . blood glucose meter kit and supplies KIT Inject 1 each into the skin daily. Dispense based on patient and insurance preference. Test once daily. (FOR ICD-9 250.00, 250.01). 1 each 0  . CALCIUM PO Take 1 tablet by mouth daily.    . cetirizine (ZYRTEC) 10 MG tablet Take 10 mg by mouth daily.    . clopidogrel (PLAVIX) 75 MG tablet Take 1 tablet (75 mg total) by mouth daily. 30 tablet 11  . glucose blood test strip accu check  one touch verio, test daily. Dx : e11.9 100 each 12  . lisinopril (ZESTRIL) 10 MG tablet Take 10 mg by mouth daily.    . metFORMIN (GLUCOPHAGE-XR) 500 MG 24 hr tablet Take 500 mg by mouth daily at 8 pm.    . oxybutynin (DITROPAN-XL) 10 MG 24 hr tablet Take 10 mg by mouth daily.     No current facility-administered medications for this visit.    PAST MEDICAL HISTORY: Past Medical History:  Diagnosis Date  . Allergy   . [place]   . Heart murmur   .  Hyperlipidemia   . Hypertension   . Hypertensive emergency 06/08/2016  . Left-sided weakness 06/08/2016  . Paresthesia   . Stroke [facility])     PAST SURGICAL HISTORY: Past Surgical History:  Procedure Laterality Date  . No past sugery      FAMILY HISTORY: Family History  Problem Relation Age of Onset  . Cancer Mother        breast  . Hyperlipidemia Mother   . Arthritis Mother   . Heart disease Mother   . Hypertension Mother   . Miscarriages / [place] Mother    . Cancer Father        throat  . Hypertension Father   . Heart disease Father        aneurysm on heart  . Diabetes Sister   . Heart disease Maternal Grandmother   . Hyperlipidemia Maternal Grandmother   . Hypertension Maternal Grandmother   . Stroke Maternal Grandfather     SOCIAL HISTORY: Social History   Socioeconomic History  . Marital status: Married    Spouse name: Legrand Como  . Number of children: 0  . Years of education: 45  . Highest education level: Associate degree: academic program  Occupational History  . Occupation: admin asst  Tobacco Use  . Smoking status: Current Every Day Smoker    Packs/day: 1.00    Years: 20.00    Pack years: 20.00    Types: Cigarettes  . Smokeless tobacco: Never Used  . Tobacco comment: 1 ppd  Vaping Use  . Vaping Use: Never used  Substance and Sexual Activity  . Alcohol use: Never    Alcohol/week: 0.0 standard drinks  . Drug use: No  . Sexual activity: Yes    Birth control/protection: Post-menopausal  Other Topics Concern  . Not on file  Social History Narrative   Associate degree in visual communication.   Right-handed.   Lives at home with her husband.   Caffeine use: 1 cup per day.   Social Determinants of Health   Financial Resource Strain:   . Difficulty of Paying Living Expenses: Not on file  Food Insecurity:   . Worried About Charity fundraiser in the Last Year: Not on file  . Ran Out of Food in the Last Year: Not on file  Transportation Needs:   . Lack of Transportation (Medical): Not on file  . Lack of Transportation (Non-Medical): Not on file  Physical Activity:   . Days of Exercise per Week: Not on file  . Minutes of Exercise per Session: Not on file  Stress:   . Feeling of Stress : Not on file  Social Connections:   . Frequency of Communication with Friends and Family: Not on file  . Frequency of Social Gatherings with Friends and Family: Not on file  . Attends Religious Services: Not on file  . Active  Member of Clubs or Organizations: Not on file  . Attends Archivist Meetings: Not on file  . Marital Status: Not on file  Intimate Partner Violence:   . Fear of Current or Ex-Partner: Not on file  . Emotionally Abused: Not on file  . Physically Abused: Not on file  . Sexually Abused: Not on file     PHYSICAL EXAM   Vitals:   07/24/20 1331  BP: (!) 145/82  Pulse: 71  Weight: 196 lb 8 oz (89.1 kg)  Height: _0  (1.549 m)   Not recorded     Body mass index is 37.13 kg/m.  PHYSICAL EXAMNIATION:  Gen: NAD, conversant, well nourised, well groomed NEUROLOGICAL EXAM:  MENTAL STATUS: Speech/cognition: Awake, alert, oriented to history taking and casual conversation   CRANIAL NERVES: CN II: Visual fields are full to confrontation. Pupils are round equal and briskly reactive to light. CN III, IV, VI: extraocular movement are normal. No ptosis. CN V: Facial sensation is intact to light touch CN VII: Face is symmetric with normal eye closure  CN VIII: Hearing is normal to causal conversation. CN IX, X: Phonation is normal. CN XI: Head turning and shoulder shrug are intact  MOTOR: There is no pronator drift of out-stretched arms. Muscle bulk and tone are normal. Muscle strength is normal.  REFLEXES: Reflexes are 2+ and symmetric at the biceps, triceps, knees, and ankles. Plantar responses are flexor.  SENSORY: Intact to light touch, pinprick and vibratory sensation are intact in fingers and toes.  COORDINATION: There is no trunk or limb dysmetria noted.  GAIT/STANCE: Posture is normal. Gait is steady with normal steps, base, arm swing, and turning. Heel and toe walking are normal. Tandem gait is normal.  Romberg is absent.   DIAGNOSTIC DATA (LABS, IMAGING, TESTING) - I reviewed patient records, labs, notes, testing and imaging myself where available.   ASSESSMENT AND PLAN  Dana Ryan is a 54 y.o. female   Stroke Right paresthesia  Vascular  risk factor of hypertension, diabetes, under suboptimal control, hyperlipidemia, longtime smoker, obesity,  Continue Plavix 75 mg daily  Emphasized importance of optimize control of the vascular risk factor, goal blood pressure less than 130/80, A1c less than 7, LDL less than 70, smoke cessation, moderate exercise, increase water intake  Continue follow-up with primary care physician,  Marcial Pacas, M.D. Ph.D.   [facility] [address] [place], [place] [address] Ph: [phone] Fax: [phone]  CC:  Redmond School, MD [place],  [place] [address]  Redmond School, MD

## 2021-03-07 DIAGNOSIS — Z6835 Body mass index (BMI) 35.0-35.9, adult: Secondary | ICD-10-CM | POA: Diagnosis not present

## 2021-03-07 DIAGNOSIS — E6609 Other obesity due to excess calories: Secondary | ICD-10-CM | POA: Diagnosis not present

## 2021-03-07 DIAGNOSIS — I6930 Unspecified sequelae of cerebral infarction: Secondary | ICD-10-CM | POA: Diagnosis not present

## 2021-03-07 DIAGNOSIS — E1151 Type 2 diabetes mellitus with diabetic peripheral angiopathy without gangrene: Secondary | ICD-10-CM | POA: Diagnosis not present

## 2021-03-07 DIAGNOSIS — E782 Mixed hyperlipidemia: Secondary | ICD-10-CM | POA: Diagnosis not present

## 2021-03-07 DIAGNOSIS — I10 Essential (primary) hypertension: Secondary | ICD-10-CM | POA: Diagnosis not present

## 2021-03-07 DIAGNOSIS — F17290 Nicotine dependence, other tobacco product, uncomplicated: Secondary | ICD-10-CM | POA: Diagnosis not present

## 2021-06-12 IMAGING — MR MR MRA NECK WO/W CM
2 series · 44 of 48 positions shown · IV contrast (Gadavist)
Comparison: None.

CLINICAL DATA: Right-sided numbness and weakness for 6 weeks

EXAM:
MR HEAD WITHOUT CONTRAST
MR CIRCLE OF WILLIS WITHOUT CONTRAST
MRA OF THE NECK WITHOUT AND WITH CONTRAST
TECHNIQUE: Multiplanar, multiecho pulse sequences of the brain, circle of
willis and surrounding structures were obtained without intravenous
contrast. Angiographic images of the neck were obtained using MRA
technique without and with intravenous contrast.
CONTRAST:  10mL GADAVIST GADOBUTROL 1 MMOL/ML IV SOLN

[Series 6: angio_fl3d_cor_post_ttc=3.0s · coronal · 0.9mm · 0.85mm/px · 24 of 88 slices shown]
[im 1/88]
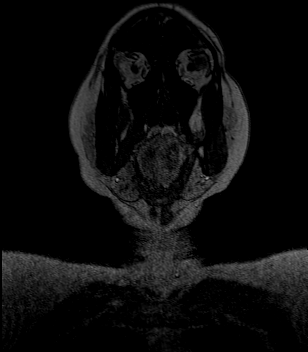
[im 4/88]
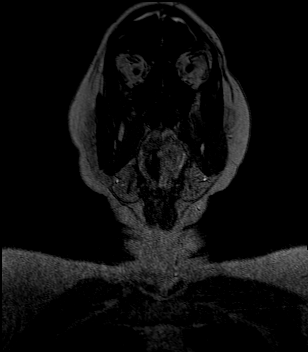
[im 8/88]
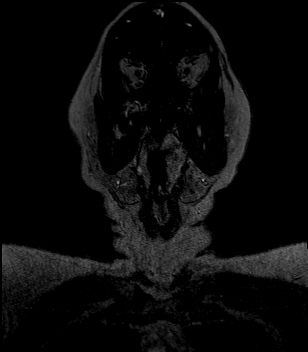
[im 12/88]
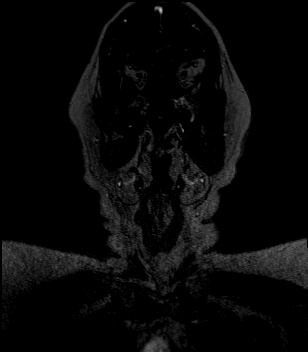
[im 16/88]
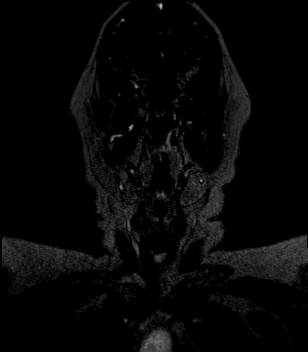
[im 19/88]
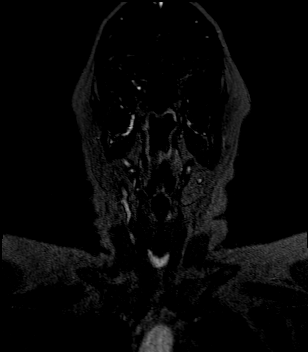
[im 23/88]
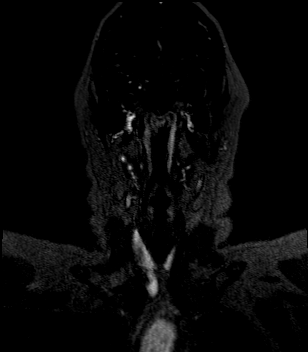
[im 27/88]
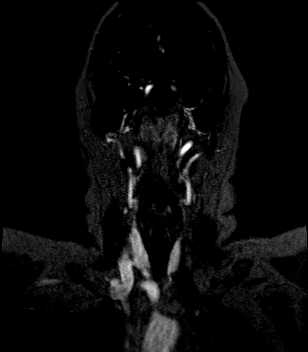
[im 31/88]
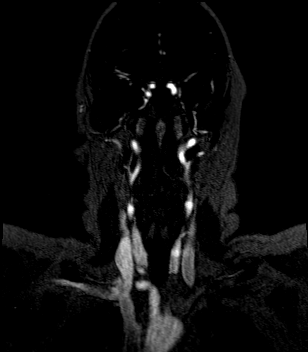
[im 35/88]
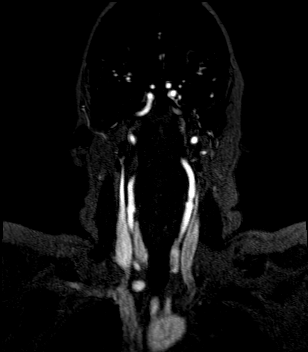
[im 38/88]
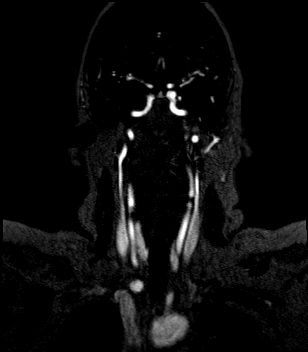
[im 42/88]
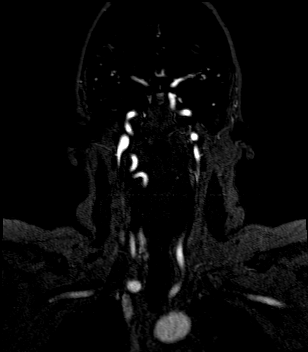
[im 46/88]
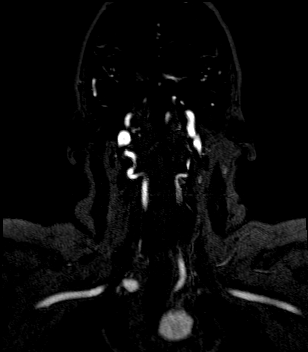
[im 50/88]
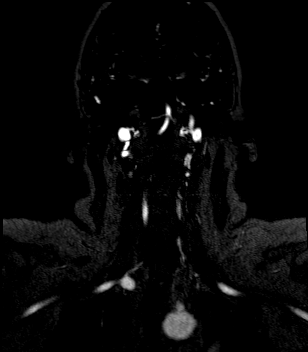
[im 53/88]
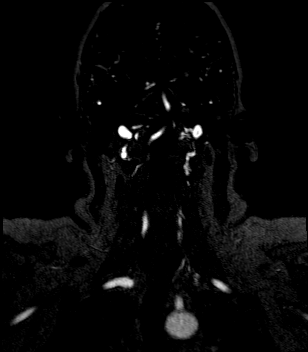
[im 57/88]
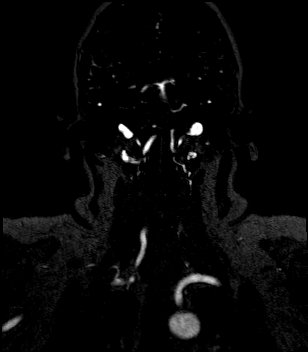
[im 61/88]
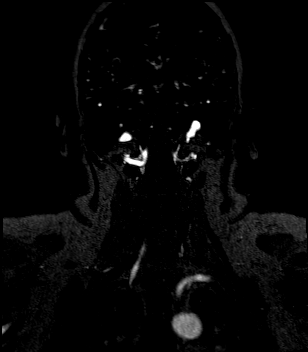
[im 65/88]
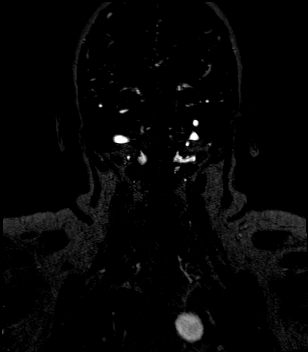
[im 69/88]
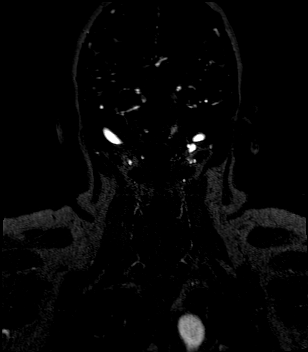
[im 72/88]
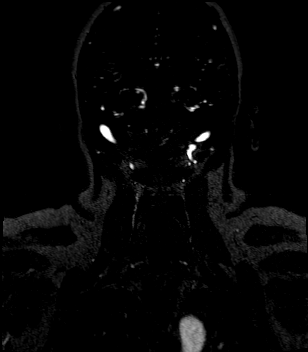
[im 76/88]
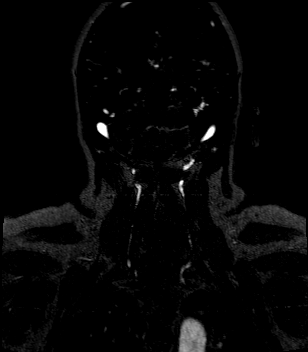
[im 80/88]
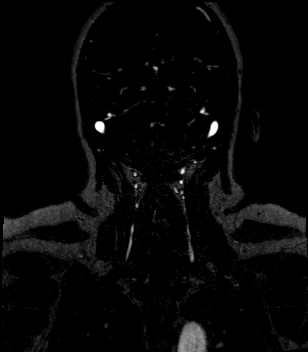
[im 84/88]
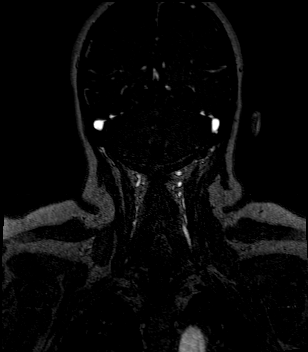
[im 88/88]
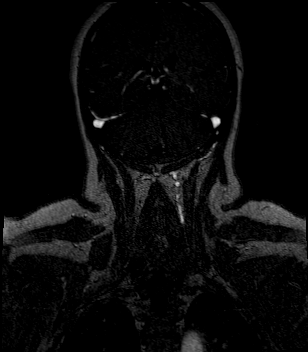

[Series 8: angio_fl3d_cor_post_ttc=3.0s_moco-adv_sub · coronal · 0.9mm · 0.85mm/px · 20 of 88 slices shown]
[im 1/88]
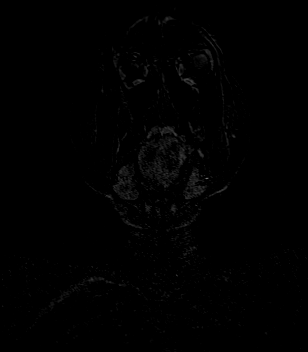
[im 4/88]
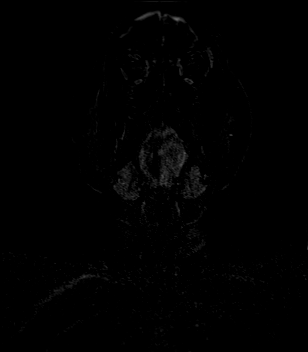
[im 8/88]
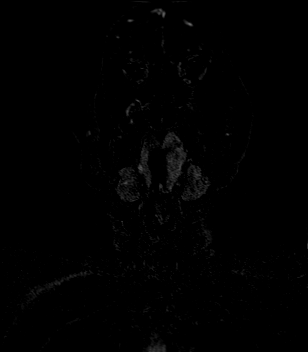
[im 12/88]
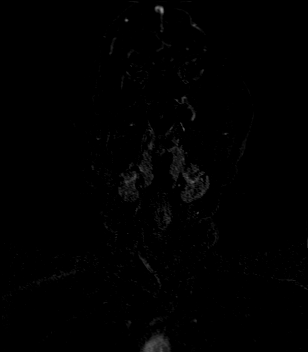
[im 16/88]
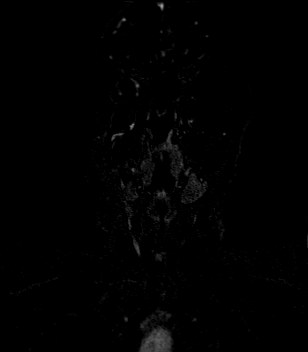
[im 19/88]
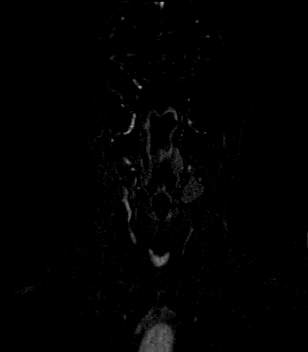
[im 23/88]
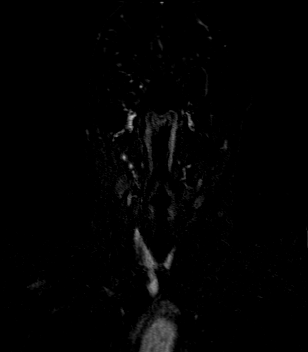
[im 27/88]
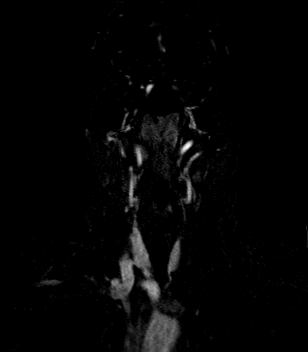
[im 31/88]
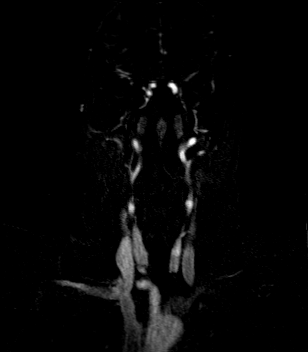
[im 35/88]
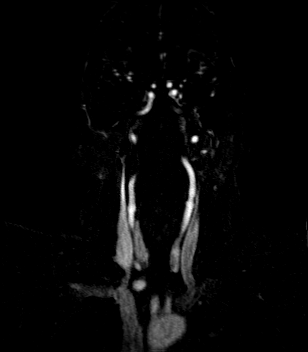
[im 38/88]
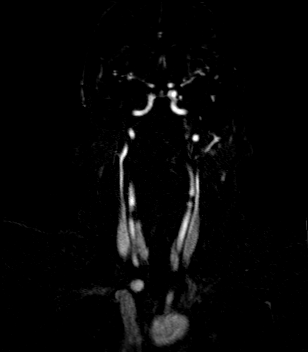
[im 42/88]
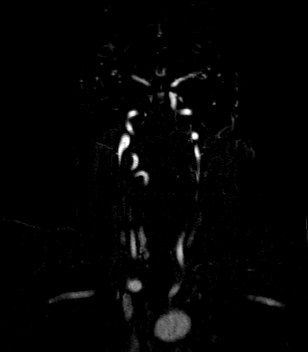
[im 46/88]
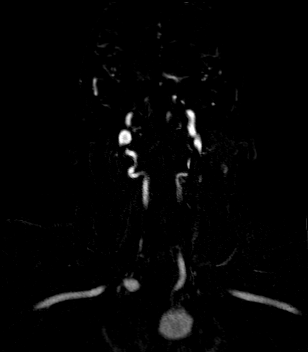
[im 50/88]
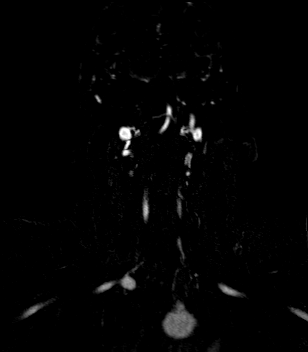
[im 53/88]
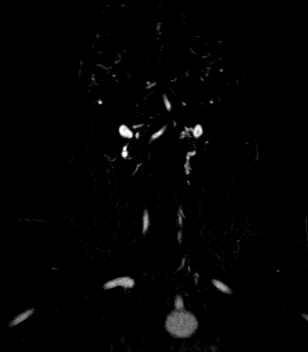
[im 57/88]
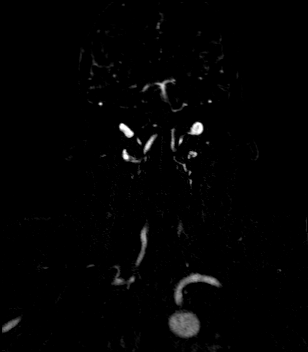
[im 61/88]
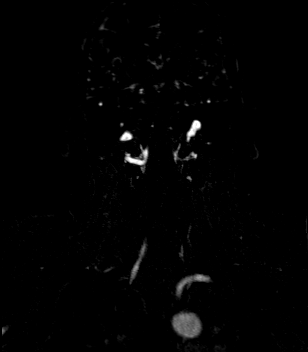
[im 72/88]
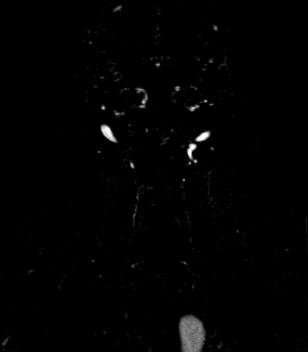
[im 76/88]
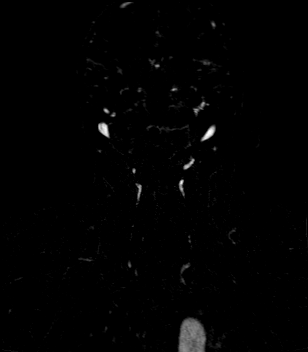
[im 84/88]
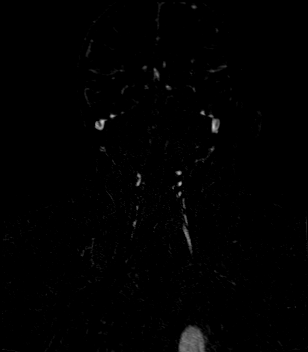

[44 of 48 positions shown; findings below may reference images not displayed]

FINDINGS: MR HEAD FINDINGS

Brain: No acute infarct, acute hemorrhage or extra-axial collection.
Old small vessel infarct of the left pons. Small linear chronic
infarct of the right centrum semiovale. White matter signal
otherwise normal. Normal volume of CSF spaces. No chronic
microhemorrhage. Normal midline structures.

Vascular: Normal flow voids.

Skull and upper cervical spine: Normal marrow signal.

Sinuses/Orbits: Negative.

Other: None.

MR CIRCLE OF WILLIS FINDINGS

POSTERIOR CIRCULATION:

--Vertebral arteries: Normal V4 segments.

--Inferior cerebellar arteries: Normal.

--Basilar artery: Normal.

--Superior cerebellar arteries: Normal.

--Posterior cerebral arteries: Mild stenosis of the left P1 segment.
Otherwise normal.

ANTERIOR CIRCULATION:

--Intracranial internal carotid arteries: Normal.

--Anterior cerebral arteries (ACA): Normal. Absent right A1 segment,
normal variant

--Middle cerebral arteries (MCA): Normal.

MRA NECK FINDINGS

No stenosis of the right carotid system. Mild narrowing at the
proximal left internal carotid artery without significant stenosis
by NASCET criteria. Vertebral system is right dominant. The left V1
segment is poorly visualized and may be narrowed. Flow related
enhancement of the left vertebral artery is generally decreased
relative to the right.
IMPRESSION: 1. No acute intracranial abnormality.
2. Old small vessel infarcts of the left pons and right centrum
semiovale.
3. Mild stenosis of the left PCA P1 segment.
4. Poor visualization of the left vertebral artery V1 segment with
overall decreased flow related enhancement relative to the right.
This may be due to decreased flow velocity or multifocal narrowing.
Correlation with vascular ultrasound or CTA neck is recommended.
5. Mild narrowing of the proximal left internal carotid artery
without significant stenosis by NASCET criteria.

## 2021-09-24 DIAGNOSIS — Z6832 Body mass index (BMI) 32.0-32.9, adult: Secondary | ICD-10-CM | POA: Diagnosis not present

## 2021-09-24 DIAGNOSIS — E669 Obesity, unspecified: Secondary | ICD-10-CM | POA: Diagnosis not present

## 2021-09-24 DIAGNOSIS — E119 Type 2 diabetes mellitus without complications: Secondary | ICD-10-CM | POA: Diagnosis not present

## 2021-09-24 DIAGNOSIS — Z1331 Encounter for screening for depression: Secondary | ICD-10-CM | POA: Diagnosis not present

## 2021-09-24 DIAGNOSIS — I10 Essential (primary) hypertension: Secondary | ICD-10-CM | POA: Diagnosis not present

## 2022-07-18 ENCOUNTER — Other Ambulatory Visit (HOSPITAL_COMMUNITY): Payer: Self-pay | Admitting: Internal Medicine

## 2022-07-18 DIAGNOSIS — I6930 Unspecified sequelae of cerebral infarction: Secondary | ICD-10-CM | POA: Diagnosis not present

## 2022-07-18 DIAGNOSIS — Z6832 Body mass index (BMI) 32.0-32.9, adult: Secondary | ICD-10-CM | POA: Diagnosis not present

## 2022-07-18 DIAGNOSIS — Z1331 Encounter for screening for depression: Secondary | ICD-10-CM | POA: Diagnosis not present

## 2022-07-18 DIAGNOSIS — R0989 Other specified symptoms and signs involving the circulatory and respiratory systems: Secondary | ICD-10-CM

## 2022-07-18 DIAGNOSIS — E119 Type 2 diabetes mellitus without complications: Secondary | ICD-10-CM | POA: Diagnosis not present

## 2022-07-18 DIAGNOSIS — Z0001 Encounter for general adult medical examination with abnormal findings: Secondary | ICD-10-CM | POA: Diagnosis not present

## 2022-07-18 DIAGNOSIS — Z1231 Encounter for screening mammogram for malignant neoplasm of breast: Secondary | ICD-10-CM | POA: Diagnosis not present

## 2022-07-18 DIAGNOSIS — E6609 Other obesity due to excess calories: Secondary | ICD-10-CM | POA: Diagnosis not present

## 2022-07-18 DIAGNOSIS — I10 Essential (primary) hypertension: Secondary | ICD-10-CM | POA: Diagnosis not present

## 2023-03-10 DIAGNOSIS — H6123 Impacted cerumen, bilateral: Secondary | ICD-10-CM | POA: Diagnosis not present

## 2023-03-24 ENCOUNTER — Institutional Professional Consult (permissible substitution) (INDEPENDENT_AMBULATORY_CARE_PROVIDER_SITE_OTHER): Payer: 59 | Admitting: Otolaryngology

## 2023-09-09 ENCOUNTER — Other Ambulatory Visit (HOSPITAL_COMMUNITY): Payer: Self-pay | Admitting: Internal Medicine

## 2023-09-09 DIAGNOSIS — Z136 Encounter for screening for cardiovascular disorders: Secondary | ICD-10-CM | POA: Diagnosis not present

## 2023-09-09 DIAGNOSIS — Z1231 Encounter for screening mammogram for malignant neoplasm of breast: Secondary | ICD-10-CM | POA: Diagnosis not present

## 2023-09-09 DIAGNOSIS — E6609 Other obesity due to excess calories: Secondary | ICD-10-CM | POA: Diagnosis not present

## 2023-09-09 DIAGNOSIS — I6930 Unspecified sequelae of cerebral infarction: Secondary | ICD-10-CM | POA: Diagnosis not present

## 2023-09-09 DIAGNOSIS — Z6834 Body mass index (BMI) 34.0-34.9, adult: Secondary | ICD-10-CM | POA: Diagnosis not present

## 2023-09-09 DIAGNOSIS — I10 Essential (primary) hypertension: Secondary | ICD-10-CM | POA: Diagnosis not present

## 2023-09-09 DIAGNOSIS — Z0001 Encounter for general adult medical examination with abnormal findings: Secondary | ICD-10-CM | POA: Diagnosis not present

## 2023-09-09 DIAGNOSIS — E1159 Type 2 diabetes mellitus with other circulatory complications: Secondary | ICD-10-CM | POA: Diagnosis not present

## 2023-09-23 ENCOUNTER — Ambulatory Visit (HOSPITAL_COMMUNITY)
Admission: RE | Admit: 2023-09-23 | Discharge: 2023-09-23 | Disposition: A | Discharge status: Home / Self Care | Payer: BC Managed Care – PPO | Source: Ambulatory Visit | Attending: Internal Medicine | Admitting: Internal Medicine

## 2023-09-23 DIAGNOSIS — I6521 Occlusion and stenosis of right carotid artery: Secondary | ICD-10-CM | POA: Diagnosis not present

## 2023-09-23 DIAGNOSIS — Z136 Encounter for screening for cardiovascular disorders: Secondary | ICD-10-CM | POA: Diagnosis not present

## 2023-10-20 ENCOUNTER — Ambulatory Visit (HOSPITAL_COMMUNITY)
Admission: RE | Admit: 2023-10-20 | Discharge: 2023-10-20 | Disposition: A | Discharge status: Home / Self Care | Payer: BC Managed Care – PPO | Source: Ambulatory Visit | Attending: Internal Medicine | Admitting: Internal Medicine

## 2023-10-20 DIAGNOSIS — Z1231 Encounter for screening mammogram for malignant neoplasm of breast: Secondary | ICD-10-CM | POA: Insufficient documentation

## 2023-10-28 ENCOUNTER — Other Ambulatory Visit (HOSPITAL_COMMUNITY): Payer: Self-pay | Admitting: Internal Medicine

## 2023-10-28 DIAGNOSIS — R928 Other abnormal and inconclusive findings on diagnostic imaging of breast: Secondary | ICD-10-CM

## 2023-11-13 ENCOUNTER — Other Ambulatory Visit (HOSPITAL_COMMUNITY): Payer: Self-pay | Admitting: Internal Medicine

## 2023-11-13 ENCOUNTER — Ambulatory Visit (HOSPITAL_COMMUNITY)
Admission: RE | Admit: 2023-11-13 | Discharge: 2023-11-13 | Disposition: A | Discharge status: Home / Self Care | Payer: BC Managed Care – PPO | Source: Ambulatory Visit | Attending: Internal Medicine | Admitting: Internal Medicine

## 2023-11-13 DIAGNOSIS — R928 Other abnormal and inconclusive findings on diagnostic imaging of breast: Secondary | ICD-10-CM | POA: Diagnosis not present

## 2023-11-13 DIAGNOSIS — R921 Mammographic calcification found on diagnostic imaging of breast: Secondary | ICD-10-CM

## 2023-11-13 DIAGNOSIS — R92321 Mammographic fibroglandular density, right breast: Secondary | ICD-10-CM | POA: Diagnosis not present

## 2023-11-20 ENCOUNTER — Ambulatory Visit
Admission: RE | Admit: 2023-11-20 | Discharge: 2023-11-20 | Disposition: A | Discharge status: Home / Self Care | Source: Ambulatory Visit | Attending: Internal Medicine | Admitting: Internal Medicine

## 2023-11-20 DIAGNOSIS — R928 Other abnormal and inconclusive findings on diagnostic imaging of breast: Secondary | ICD-10-CM

## 2023-11-20 DIAGNOSIS — R921 Mammographic calcification found on diagnostic imaging of breast: Secondary | ICD-10-CM

## 2023-11-20 DIAGNOSIS — R92321 Mammographic fibroglandular density, right breast: Secondary | ICD-10-CM | POA: Diagnosis not present

## 2023-11-20 DIAGNOSIS — N6489 Other specified disorders of breast: Secondary | ICD-10-CM | POA: Diagnosis not present

## 2023-11-20 HISTORY — PX: BREAST BIOPSY: SHX20

## 2023-11-21 LAB — SURGICAL PATHOLOGY

## 2024-03-10 DIAGNOSIS — E6609 Other obesity due to excess calories: Secondary | ICD-10-CM | POA: Diagnosis not present

## 2024-03-10 DIAGNOSIS — I10 Essential (primary) hypertension: Secondary | ICD-10-CM | POA: Diagnosis not present

## 2024-03-10 DIAGNOSIS — Z9229 Personal history of other drug therapy: Secondary | ICD-10-CM | POA: Diagnosis not present

## 2024-03-10 DIAGNOSIS — Z6834 Body mass index (BMI) 34.0-34.9, adult: Secondary | ICD-10-CM | POA: Diagnosis not present

## 2024-03-10 DIAGNOSIS — I6930 Unspecified sequelae of cerebral infarction: Secondary | ICD-10-CM | POA: Diagnosis not present

## 2024-05-07 DIAGNOSIS — E782 Mixed hyperlipidemia: Secondary | ICD-10-CM | POA: Diagnosis not present

## 2024-05-07 DIAGNOSIS — R7303 Prediabetes: Secondary | ICD-10-CM | POA: Diagnosis not present

## 2024-05-07 DIAGNOSIS — Z7689 Persons encountering health services in other specified circumstances: Secondary | ICD-10-CM | POA: Diagnosis not present

## 2024-05-07 DIAGNOSIS — I10 Essential (primary) hypertension: Secondary | ICD-10-CM | POA: Diagnosis not present

## 2024-05-07 DIAGNOSIS — Z8673 Personal history of transient ischemic attack (TIA), and cerebral infarction without residual deficits: Secondary | ICD-10-CM | POA: Diagnosis not present
